# Patient Record
Sex: Female | Born: 1960
Health system: Southern US, Community
[De-identification: ages and names within clinical notes are randomized; demographics above are authoritative.]

## PROBLEM LIST (undated history)

## (undated) DIAGNOSIS — Z8249 Family history of ischemic heart disease and other diseases of the circulatory system: Secondary | ICD-10-CM

## (undated) DIAGNOSIS — I493 Ventricular premature depolarization: Secondary | ICD-10-CM

## (undated) DIAGNOSIS — R0609 Other forms of dyspnea: Principal | ICD-10-CM

## (undated) DIAGNOSIS — I5032 Chronic diastolic (congestive) heart failure: Secondary | ICD-10-CM

## (undated) HISTORY — PX: HERNIA REPAIR: SHX51

## (undated) HISTORY — PX: OTHER SURGICAL HISTORY: SHX169

## (undated) HISTORY — DX: Other forms of dyspnea: R06.09

## (undated) HISTORY — DX: Chronic diastolic (congestive) heart failure: I50.32

## (undated) HISTORY — DX: Family history of ischemic heart disease and other diseases of the circulatory system: Z82.49

## (undated) HISTORY — PX: APPENDECTOMY: SHX54

## (undated) HISTORY — DX: Ventricular premature depolarization: I49.3

## (undated) HISTORY — PX: ABDOMINAL HYSTERECTOMY: SHX81

---

## 2007-08-14 ENCOUNTER — Emergency Department (HOSPITAL_COMMUNITY): Admission: EM | Admit: 2007-08-14 | Discharge: 2007-08-14 | Payer: Self-pay | Admitting: Emergency Medicine

## 2008-10-25 ENCOUNTER — Other Ambulatory Visit: Admission: RE | Admit: 2008-10-25 | Discharge: 2008-10-25 | Payer: Self-pay | Admitting: Family Medicine

## 2008-11-29 ENCOUNTER — Encounter (INDEPENDENT_AMBULATORY_CARE_PROVIDER_SITE_OTHER): Payer: Self-pay | Admitting: Obstetrics and Gynecology

## 2008-11-29 ENCOUNTER — Ambulatory Visit (HOSPITAL_COMMUNITY): Admission: RE | Admit: 2008-11-29 | Discharge: 2008-11-30 | Payer: Self-pay | Admitting: Obstetrics and Gynecology

## 2011-01-08 LAB — BASIC METABOLIC PANEL
BUN: 11 mg/dL (ref 6–23)
CO2: 26 mEq/L (ref 19–32)
Creatinine, Ser: 0.53 mg/dL (ref 0.4–1.2)
GFR calc non Af Amer: >60 mL/min (ref >60)
Potassium: 3.5 mEq/L (ref 3.5–5.1)
Sodium: 138 mEq/L (ref 135–145)

## 2011-01-08 LAB — CBC
HCT: 35.2 % — ABNORMAL LOW (ref 36.0–46.0)
Hemoglobin: 12 g/dL (ref 12.0–15.0)
MCHC: 33.8 g/dL (ref 30.0–36.0)
MCV: 95 fL (ref 78.0–100.0)
MCV: 95.3 fL (ref 78.0–100.0)
RBC: 3.69 MIL/uL — ABNORMAL LOW (ref 3.87–5.11)
RDW: 12.9 % (ref 11.5–15.5)

## 2011-01-08 LAB — HCG, SERUM, QUALITATIVE: Preg, Serum: NEGATIVE

## 2011-02-10 NOTE — H&P (Signed)
NAMEJAYME, [name]                  ACCOUNT NO.:  [id number]   MEDICAL RECORD NO.:  [id number]          PATIENT TYPE:  AMB   LOCATION:  SDC                           [facility]  [facility]   PHYSICIAN:  Charles A. Delcambre, MDDATE OF BIRTH:  25-Jan-1961   DATE OF ADMISSION:  DATE OF DISCHARGE:                              HISTORY & PHYSICAL   She is a 50 year old gravida 0, para 0, not desiring further  childbearing capacity with a very symptomatic uterus, fibroid uterine  with fibroids.  She has some pelvic pressure.  Urinary frequency without  infection being noted in the urinalysis at her PCP Yvonne [name] at  [facility].  Pelvic pressure, pants do not fit, and bloated feeling much  exacerbated over the last 6 months.   PAST MEDICAL HISTORY:  None.  She states she was nonanemic with check  with Yvonne [name].   SURGICAL HISTORY:  Lymph nodes in her submaxillary area removed, benign  laparotomy for appendectomy, chronic rupture, inguinal hernia on the  left fifth digit and pinning.   MEDICATIONS:  DayQuil for cold every now and then, but none other.   ALLERGIES:  No known drug allergies.   SOCIAL HISTORY:  No tobacco, one drink per month.  She is married,  sexual active with her husband, monogamous relationship.  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   FAMILY HISTORY:  Heart attack for father died at 74 year old of heart  disease.   REVIEW OF SYSTEMS:  Denies chest pain, shortness of breath, nausea or  vomiting, diarrhea, or constipation.  She does have urinary frequency.  Her periods are heavy with changing the pads every hour on the second  and third day of the four-day period.  Periods are regular.  She does  have pelvic pain and pressure as her main problem at this point.  She  states it is the urinary frequency and pressure that she feels something  special in the bladder.   PHYSICAL EXAMINATION:  GENERAL:  Alert and oriented x3.  VITAL SIGNS:  Blood  pressure 128/78, height 5 feet 8 inches, and weight  163 pounds.  CORONARY:  Regular rate and rhythm.  LUNGS: Clear bilaterally.  ABDOMEN:  Enlarged uterus palpable consistent with fibroids of about 18-  week size, nontender, mobile.  PELVIC:  Deferred.  Ultrasound did show multiple fibroids, the largest  of which was 12 cm along with similar uterine segment fibroids as well.  We will plan on n.p.o. past midnight before we proceed with the surgery.   Risks of  infection, bleeding, bowel and bladder damage, ureteral  damage, blood product risk including hepatitis and HIV exposure, DVT,  were all discussed.  All questions were answered and she gives informed  consent.  At the superior portion of her previous laparotomy, she feels  she might be getting a hernia in this area.  I stated we would  palpate from inside this area.  If anything major was found, she will be  referred to General Surgery for mesh placement, etc.  Otherwise, if a  small defect is noted, I will ectend the incision up to that point and  plicate and reinforce the fascial closure at that level.  She is in  agreement with this plan.  We will proceed as outlined.      Charles A. Sydnee Cabal, MD  Electronically Signed     CAD/MEDQ  D:  11/20/2008  T:  11/21/2008  Job:  [id number]
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
## 2011-02-10 NOTE — Op Note (Signed)
NAMEROYELLE, HINCHMAN                  ACCOUNT NO.:  [id number]   MEDICAL RECORD NO.:  [id number]          PATIENT TYPE:  INP   LOCATION:  9310                          [facility]ILITY:  [facility]   PHYSICIAN:  Charles A. Delcambre, MDDATE OF BIRTH:  24-Nov-1960   DATE OF PROCEDURE:  11/29/2008  DATE OF DISCHARGE:                               OPERATIVE REPORT   PREOPERATIVE DIAGNOSES:  Symptomatic uterine fibroids at 20 weeks.  Symptomatic with pain and pressure and an enlarging abdomen.   POSTOPERATIVE DIAGNOSIS:  Symptomatic uterine fibroids at 20 weeks.   PROCEDURE:  Transabdominal hysterectomy   SURGEON:  Charles A. Sydnee Cabal, MD   ASSISTANT:  Gerald Leitz, MD   ANESTHESIA:  General by the endotracheal route.   FINDINGS:  Large uterine fibroids, multiple impinging on the bladder and  the sigmoid posteriorly, normal ovaries.   SPECIMEN:  Uterus with cervix to Pathology.   ESTIMATED BLOOD LOSS:  250 mL.   URINE OUTPUT:  150 mL.   SPECIMEN:  Sponge and needle count correct x2.   COMPLICATIONS:  None.   DESCRIPTION OF PROCEDURE:  The patient was taken to the operating room,  placed in supine position.  General anesthetic was induced without  difficulty.  She was then sterilely prepped and draped.  Vertical skin  incision was made up to the umbilicus.  Rectus muscles were bluntly and  sharply dissected.  Peritoneum was opened with Metzenbaum scissors  without damage to the bowel.  This incision was taken superiorly and  inferiorly.  There were no adhesions noted to the previous laparotomy  for ruptured appendix.  Fascia and peritoneum were taken down to the  symphysis pubis.  This allowed enough room to expose the large uterus,  but not get a retractor in.  At that time, round ligaments were taken,  transfixion stitched, and held.  Cautery was used to open the  peritoneum.  Metzenbaum scissors were used to take the parietal  peritoneum down to the lower uterine segment over a large  fibroid.  Broad ligament was opened on the right with single-pass screw bluntly  per her request.  Ovaries were retained.  Utero-ovarian pedicle was cut,  free tied, and then transfixion stitch was placed on the ovarian pedicle  with good hemostasis resulting.  The same was done on the left side  opening the round ligament, taken the bladder down anteriorly passing  through the broad ligament isolating the utero-ovarian pedicle and  clamping, cutting, and free tied and then transfixion stitch, hemostasis  was excellent.  Attention was then given to further taking down the  bladder anteriorly.  This was done with the Metzenbaum scissors and some  blunt dissection.  Uterine vessels were able to be skeletonized on  either side. I could not fit a retractor in at this point, but a Lahey  clamp on the fundus as well as Kelly clamps on the cornual areas did  allow [place] enough mobilization to see adequately the areas we were  working.  Uterine vessels were taken on the left with backbleeding clamp  and transfixion stitched.  The same was done on the right.  Then a  second clamp was taken down on the left across the uterosacrals and the  vaginal angle, and vaginal angle was opened.  This was transfixion  stitched and held.  One further clamp was taken down on the right.  I  did not get in to the vagina.  It was transfixion stitched.  One further  clamp around, I did get into the vagina.  This was transfixion stitched  and held.  Jorgenson scissors were used to excise the cervix from the  vagina.  Kochers were placed on the angles and the anterior and  posterior leaves of the vagina.  Richardson angle sutures were placed on  either side and tied.  A running 0 locking suture was used to close the  cuff.  Irrigation was carried out.  Minor electrocautery was used on the  bladder edge superiorly.  A single figure-of-eight suture was placed in  the bladder around this area to achieve better hemostasis.   Hemostasis  was excellent.  Irrigation was carried out.  Again, all pedicles were of  excellent hemostasis.  Cuff was of excellent hemostasis.  The ovaries  were free and were sutured to the round ligaments to suspend the amount  of the cuff area.  This was done without difficulty.  We had used 1 lap  in the case and we removed this.  Lap count was correct, and we  proceeded with an en bloc closure with 0 double-stranded PDS loop.  Loop  knot was buried suprapubically once tied.  Irrigation was carried out.  Incision was relaxed some to come together better with cautery.  Hemostasis was excellent, and staples were used close the skin.  Sterile  dressing was applied.  The patient was taken to recovery with physician  in attendance having tolerated the procedure well.      Charles A. Sydnee Cabal, MD  Electronically Signed     CAD/MEDQ  D:  11/29/2008  T:  11/30/2008  Job:  [id number]

## 2012-08-10 ENCOUNTER — Other Ambulatory Visit: Payer: Self-pay | Admitting: Family Medicine

## 2012-08-10 DIAGNOSIS — Z139 Encounter for screening, unspecified: Secondary | ICD-10-CM

## 2012-08-17 ENCOUNTER — Ambulatory Visit
Admission: RE | Admit: 2012-08-17 | Discharge: 2012-08-17 | Disposition: A | Discharge status: Home / Self Care | Payer: No Typology Code available for payment source | Source: Ambulatory Visit | Attending: Family Medicine | Admitting: Family Medicine

## 2012-08-17 DIAGNOSIS — Z139 Encounter for screening, unspecified: Secondary | ICD-10-CM

## 2013-08-18 ENCOUNTER — Other Ambulatory Visit: Payer: Self-pay

## 2013-08-18 DIAGNOSIS — Z1231 Encounter for screening mammogram for malignant neoplasm of breast: Secondary | ICD-10-CM

## 2013-09-15 ENCOUNTER — Ambulatory Visit: Payer: No Typology Code available for payment source

## 2013-10-10 ENCOUNTER — Ambulatory Visit
Admission: RE | Admit: 2013-10-10 | Discharge: 2013-10-10 | Disposition: A | Discharge status: Home / Self Care | Payer: No Typology Code available for payment source | Source: Ambulatory Visit

## 2013-10-10 DIAGNOSIS — Z1231 Encounter for screening mammogram for malignant neoplasm of breast: Secondary | ICD-10-CM

## 2014-09-24 ENCOUNTER — Other Ambulatory Visit: Payer: Self-pay

## 2014-09-24 DIAGNOSIS — Z1231 Encounter for screening mammogram for malignant neoplasm of breast: Secondary | ICD-10-CM

## 2014-10-17 ENCOUNTER — Encounter (INDEPENDENT_AMBULATORY_CARE_PROVIDER_SITE_OTHER): Payer: Self-pay

## 2014-10-17 ENCOUNTER — Ambulatory Visit
Admission: RE | Admit: 2014-10-17 | Discharge: 2014-10-17 | Disposition: A | Discharge status: Home / Self Care | Payer: No Typology Code available for payment source | Source: Ambulatory Visit

## 2014-10-17 DIAGNOSIS — Z1231 Encounter for screening mammogram for malignant neoplasm of breast: Secondary | ICD-10-CM

## 2015-09-25 ENCOUNTER — Other Ambulatory Visit (HOSPITAL_BASED_OUTPATIENT_CLINIC_OR_DEPARTMENT_OTHER): Payer: Self-pay | Admitting: Family Medicine

## 2015-09-25 DIAGNOSIS — Z1231 Encounter for screening mammogram for malignant neoplasm of breast: Secondary | ICD-10-CM

## 2015-11-01 ENCOUNTER — Ambulatory Visit: Payer: No Typology Code available for payment source

## 2015-11-06 ENCOUNTER — Ambulatory Visit
Admission: RE | Admit: 2015-11-06 | Discharge: 2015-11-06 | Disposition: A | Discharge status: Home / Self Care | Payer: Managed Care, Other (non HMO) | Source: Ambulatory Visit | Attending: Family Medicine | Admitting: Family Medicine

## 2015-11-06 DIAGNOSIS — Z1231 Encounter for screening mammogram for malignant neoplasm of breast: Secondary | ICD-10-CM

## 2016-10-26 ENCOUNTER — Other Ambulatory Visit: Payer: Self-pay | Admitting: Family Medicine

## 2016-10-26 DIAGNOSIS — Z1231 Encounter for screening mammogram for malignant neoplasm of breast: Secondary | ICD-10-CM

## 2016-11-25 ENCOUNTER — Ambulatory Visit
Admission: RE | Admit: 2016-11-25 | Discharge: 2016-11-25 | Disposition: A | Discharge status: Home / Self Care | Payer: 59 | Source: Ambulatory Visit | Attending: Family Medicine | Admitting: Family Medicine

## 2016-11-25 DIAGNOSIS — Z1231 Encounter for screening mammogram for malignant neoplasm of breast: Secondary | ICD-10-CM

## 2017-10-20 ENCOUNTER — Other Ambulatory Visit: Payer: Self-pay | Admitting: Nurse Practitioner

## 2017-10-20 DIAGNOSIS — Z1231 Encounter for screening mammogram for malignant neoplasm of breast: Secondary | ICD-10-CM

## 2017-12-08 ENCOUNTER — Ambulatory Visit
Admission: RE | Admit: 2017-12-08 | Discharge: 2017-12-08 | Disposition: A | Discharge status: Home / Self Care | Payer: BLUE CROSS/BLUE SHIELD | Source: Ambulatory Visit | Attending: Nurse Practitioner | Admitting: Nurse Practitioner

## 2017-12-08 DIAGNOSIS — Z1231 Encounter for screening mammogram for malignant neoplasm of breast: Secondary | ICD-10-CM

## 2017-12-30 ENCOUNTER — Encounter: Payer: Self-pay | Admitting: Cardiovascular Disease

## 2017-12-30 ENCOUNTER — Ambulatory Visit (INDEPENDENT_AMBULATORY_CARE_PROVIDER_SITE_OTHER): Payer: BLUE CROSS/BLUE SHIELD | Admitting: Cardiovascular Disease

## 2017-12-30 VITALS — BP 132/92 | HR 65 | Ht 68.0 in | Wt 175.8 lb

## 2017-12-30 DIAGNOSIS — R0789 Other chest pain: Secondary | ICD-10-CM

## 2017-12-30 DIAGNOSIS — R55 Syncope and collapse: Secondary | ICD-10-CM

## 2017-12-30 DIAGNOSIS — R0609 Other forms of dyspnea: Secondary | ICD-10-CM

## 2017-12-30 DIAGNOSIS — I493 Ventricular premature depolarization: Secondary | ICD-10-CM | POA: Diagnosis not present

## 2017-12-30 DIAGNOSIS — Z8249 Family history of ischemic heart disease and other diseases of the circulatory system: Secondary | ICD-10-CM

## 2017-12-30 DIAGNOSIS — R0600 Dyspnea, unspecified: Secondary | ICD-10-CM

## 2017-12-30 HISTORY — DX: Family history of ischemic heart disease and other diseases of the circulatory system: Z82.49

## 2017-12-30 HISTORY — DX: Other forms of dyspnea: R06.09

## 2017-12-30 HISTORY — DX: Ventricular premature depolarization: I49.3

## 2017-12-30 HISTORY — DX: Dyspnea, unspecified: R06.00

## 2017-12-30 LAB — COMPREHENSIVE METABOLIC PANEL
ALK PHOS: 54 IU/L (ref 39–117)
ALT: 14 IU/L (ref 0–32)
AST: 18 IU/L (ref 0–40)
Albumin/Globulin Ratio: 1.6 (ref 1.2–2.2)
Albumin: 4.2 g/dL (ref 3.5–5.5)
BUN/Creatinine Ratio: 25 — ABNORMAL HIGH (ref 9–23)
BUN: 18 mg/dL (ref 6–24)
Bilirubin Total: 0.5 mg/dL (ref 0.0–1.2)
CALCIUM: 9.7 mg/dL (ref 8.7–10.2)
CO2: 24 mmol/L (ref 20–29)
CREATININE: 0.72 mg/dL (ref 0.57–1.00)
Chloride: 102 mmol/L (ref 96–106)
GFR calc Af Amer: 108 mL/min/{1.73_m2} (ref >59)
GFR, EST NON AFRICAN AMERICAN: 94 mL/min/{1.73_m2} (ref >59)
Globulin, Total: 2.6 g/dL (ref 1.5–4.5)
Glucose: 85 mg/dL (ref 65–99)
Potassium: 4.1 mmol/L (ref 3.5–5.2)
Sodium: 141 mmol/L (ref 134–144)
Total Protein: 6.8 g/dL (ref 6.0–8.5)

## 2017-12-30 LAB — CBC WITH DIFFERENTIAL/PLATELET
BASOS ABS: 0 10*3/uL (ref 0.0–0.2)
Basos: 0 %
EOS (ABSOLUTE): 0.2 10*3/uL (ref 0.0–0.4)
Eos: 4 %
HEMOGLOBIN: 13.6 g/dL (ref 11.1–15.9)
Hematocrit: 39.6 % (ref 34.0–46.6)
IMMATURE GRANS (ABS): 0 10*3/uL (ref 0.0–0.1)
IMMATURE GRANULOCYTES: 0 %
LYMPHS: 35 %
Lymphocytes Absolute: 1.7 10*3/uL (ref 0.7–3.1)
MCH: 31.1 pg (ref 26.6–33.0)
MCHC: 34.3 g/dL (ref 31.5–35.7)
MCV: 91 fL (ref 79–97)
MONOCYTES: 12 %
Monocytes Absolute: 0.6 10*3/uL (ref 0.1–0.9)
NEUTROS ABS: 2.3 10*3/uL (ref 1.4–7.0)
Neutrophils: 49 %
Platelets: 262 10*3/uL (ref 150–379)
RBC: 4.37 x10E6/uL (ref 3.77–5.28)
RDW: 12.6 % (ref 12.3–15.4)
WBC: 4.7 10*3/uL (ref 3.4–10.8)

## 2017-12-30 LAB — TSH: TSH: 1.91 u[IU]/mL (ref 0.450–4.500)

## 2017-12-30 LAB — MAGNESIUM: MAGNESIUM: 1.8 mg/dL (ref 1.6–2.3)

## 2017-12-30 LAB — T4, FREE: FREE T4: 1.21 ng/dL (ref 0.82–1.77)

## 2017-12-30 NOTE — Patient Instructions (Addendum)
Medication Instructions:  Your physician recommends that you continue on your current medications as directed. Please refer to the Current Medication list given to you today.  Labwork: CMET/CBC/TSH/FT4/MAGNESIUM TODAY   Testing/Procedures: Your physician has requested that you have an echocardiogram. Echocardiography is a painless test that uses sound waves to create images of your heart. It provides your doctor with information about the size and shape of your heart and how well your heart's chambers and valves are working. This procedure takes approximately one hour. There are no restrictions for this procedure. [facility] AT [address]  CARDIAC CTA   Follow-Up: Your physician recommends that you schedule a follow-up appointment in: 4-6 WEEKS  Any Other Special Instructions Will Be Listed Below (If Applicable).  Echocardiogram An echocardiogram, or echocardiography, uses sound waves (ultrasound) to produce an image of your heart. The echocardiogram is simple, painless, obtained within a short period of time, and offers valuable information to your health care provider. The images from an echocardiogram can provide information such as:  Evidence of coronary artery disease (CAD).  Heart size.  Heart muscle function.  Heart valve function.  Aneurysm detection.  Evidence of a past heart attack.  Fluid buildup around the heart.  Heart muscle thickening.  Assess heart valve function.  Tell a health care provider about:  Any allergies you have.  All medicines you are taking, including vitamins, herbs, eye drops, creams, and over-the-counter medicines.  Any problems you or family members have had with anesthetic medicines.  Any blood disorders you have.  Any surgeries you have had.  Any medical conditions you have.  Whether you are pregnant or may be pregnant. What happens before the procedure? No special preparation is needed. Eat and drink  normally. What happens during the procedure?  In order to produce an image of your heart, gel will be applied to your chest and a wand-like tool (transducer) will be moved over your chest. The gel will help transmit the sound waves from the transducer. The sound waves will harmlessly bounce off your heart to allow the heart images to be captured in real-time motion. These images will then be recorded.  You may need an IV to receive a medicine that improves the quality of the pictures. What happens after the procedure? You may return to your normal schedule including diet, activities, and medicines, unless your health care provider tells you otherwise. This information is not intended to replace advice given to you by your health care provider. Make sure you discuss any questions you have with your health care provider. Document Released: 09/11/2000 Document Revised: 05/02/2016 Document Reviewed: 05/22/2013 Elsevier Interactive Patient Education  2017 Elsevier Inc.    Cardiac CT Angiogram A cardiac CT angiogram is a procedure to look at the heart and the area around the heart. It may be done to help find the cause of chest pains or other symptoms of heart disease. During this procedure, a large X-ray machine, called a CT scanner, takes detailed pictures of the heart and the surrounding area after a dye (contrast material) has been injected into blood vessels in the area. The procedure is also sometimes called a coronary CT angiogram, coronary artery scanning, or CTA. A cardiac CT angiogram allows the health care provider to see how well blood is flowing to and from the heart. The health care provider will be able to see if there are any problems, such as:  Blockage or narrowing of the coronary arteries in  the heart.  Fluid around the heart.  Signs of weakness or disease in the muscles, valves, and tissues of the heart.  Tell a health care provider about:  Any allergies you have. This is  especially important if you have had a previous allergic reaction to contrast dye.  All medicines you are taking, including vitamins, herbs, eye drops, creams, and over-the-counter medicines.  Any blood disorders you have.  Any surgeries you have had.  Any medical conditions you have.  Whether you are pregnant or may be pregnant.  Any anxiety disorders, chronic pain, or other conditions you have that may increase your stress or prevent you from lying still. What are the risks? Generally, this is a safe procedure. However, problems may occur, including:  Bleeding.  Infection.  Allergic reactions to medicines or dyes.  Damage to other structures or organs.  Kidney damage from the dye or contrast that is used.  Increased risk of cancer from radiation exposure. This risk is low. Talk with your health care provider about: ? The risks and benefits of testing. ? How you can receive the lowest dose of radiation.  What happens before the procedure?  Wear comfortable clothing and remove any jewelry, glasses, dentures, and hearing aids.  Follow instructions from your health care provider about eating and drinking. This may include: ? For 12 hours before the test - avoid caffeine. This includes tea, coffee, soda, energy drinks, and diet pills. Drink plenty of water or other fluids that do not have caffeine in them. Being well-hydrated can prevent complications. ? For 4-6 hours before the test - stop eating and drinking. The contrast dye can cause nausea, but this is less likely if your stomach is empty.  Ask your health care provider about changing or stopping your regular medicines. This is especially important if you are taking diabetes medicines, blood thinners, or medicines to treat erectile dysfunction. What happens during the procedure?  Hair on your chest may need to be removed so that small sticky patches called electrodes can be placed on your chest. These will transmit  information that helps to monitor your heart during the test.  An IV tube will be inserted into one of your veins.  You might be given a medicine to control your heart rate during the test. This will help to ensure that good images are obtained.  You will be asked to lie on an exam table. This table will slide in and out of the CT machine during the procedure.  Contrast dye will be injected into the IV tube. You might feel warm, or you may get a metallic taste in your mouth.  You will be given a medicine (nitroglycerin) to relax (dilate) the arteries in your heart.  The table that you are lying on will move into the CT machine tunnel for the scan.  The person running the machine will give you instructions while the scans are being done. You may be asked to: ? Keep your arms above your head. ? Hold your breath. ? Stay very still, even if the table is moving.  When the scanning is complete, you will be moved out of the machine.  The IV tube will be removed. The procedure may vary among health care providers and hospitals. What happens after the procedure?  You might feel warm, or you may get a metallic taste in your mouth from the contrast dye.  You may have a headache from the nitroglycerin.  After the procedure, drink water  or other fluids to wash (flush) the contrast material out of your body.  Contact a health care provider if you have any symptoms of allergy to the contrast. These symptoms include: ? Shortness of breath. ? Rash or hives. ? A racing heartbeat.  Most people can return to their normal activities right after the procedure. Ask your health care provider what activities are safe for you.  It is up to you to get the results of your procedure. Ask your health care provider, or the department that is doing the procedure, when your results will be ready. Summary  A cardiac CT angiogram is a procedure to look at the heart and the area around the heart. It may be done  to help find the cause of chest pains or other symptoms of heart disease.  During this procedure, a large X-ray machine, called a CT scanner, takes detailed pictures of the heart and the surrounding area after a dye (contrast material) has been injected into blood vessels in the area.  Ask your health care provider about changing or stopping your regular medicines before the procedure. This is especially important if you are taking diabetes medicines, blood thinners, or medicines to treat erectile dysfunction.  After the procedure, drink water or other fluids to wash (flush) the contrast material out of your body. This information is not intended to replace advice given to you by your health care provider. Make sure you discuss any questions you have with your health care provider. Document Released: 08/27/2008 Document Revised: 08/03/2016 Document Reviewed: 08/03/2016 Elsevier Interactive Patient Education  2017 [employer].  Please arrive at the [employer] main entrance of [facility] at xx:xx AM (30-45 minutes prior to test start time)  [facility] [address] [place], [place] [address] [phone]  Proceed to the [facility] Radiology Department (First Floor).  Please follow these instructions carefully (unless otherwise directed):  Hold all erectile dysfunction medications at least 48 hours prior to test.  On the Night Before the Test: . Drink plenty of water. . Do not consume any caffeinated/decaffeinated beverages or chocolate 12 hours prior to your test. . Do not take any antihistamines 12 hours prior to your test. On the Day of the Test: . Drink plenty of water. Do not drink any water within one hour of the test. . Do not eat any food 4 hours prior to the test. . You may take your regular medications prior to the test.  After the Test: . Drink plenty of water. . After receiving IV contrast, you may experience a mild flushed feeling. This is  normal. . On occasion, you may experience a mild rash up to 24 hours after the test. This is not dangerous. If this occurs, you can take Benadryl 25 mg and increase your fluid intake. . If you experience trouble breathing, this can be serious. If it is severe call 911 IMMEDIATELY. If it is mild, please call our office. . If you take any of these medications: Glipizide/Metformin, Avandament, Glucavance, please do not take 48 hours after completing test.

## 2017-12-30 NOTE — Progress Notes (Signed)
Cardiology Office Note   Date:  12/30/2017   ID:  [name], DOB 12/05/1960, MRN [id number]  PCP:  [place], Julie M, NP  Cardiologist:   Chilton Siiffany [place], MD   Chief Complaint  Patient presents with  . Follow-up    pt c/o SOB on exertion, pt denied chest pain     History of Present Illness: Yvonne SessionsLisa R [name] is a 57 y.o. female who is being seen today for the evaluation of shortness of breath at the request of [place], Julie M, NP.  Yvonne [name]'s father had an MI at age 57 and then died of a second MI at age 57.  She has tried to be proactive about her health and started biking in colleges.  She rides her bike over 1,000 miles per year.  In the past she had no exertional symptoms.  She has not experienced any chest pain but notes that over the last 4-6 weeks she is not feeling as well.  She is unable to push herself as much.  She is much slower on the bike despite exerting herself equally or even more than in the past.  She also notices that her heart rate increases to 150 or 160 upon minimal exertion.  In the past it only got this high with peak exercise.  She had an episode of near syncope after riding for only 7 or 8 miles.  She had to stop and lie on the ground in order to avoid passing out.  She has not noted any lower extremity edema orthopnea.  She does get tired when walking upstairs.  She checks her blood pressure at home and at work and it typically is around 115/70.  She has not felt any palpitations.   Past Medical History:  Diagnosis Date  . Exertional dyspnea 12/30/2017  . Family history of premature CAD 12/30/2017   Father first MI at age 57, died at age 10134.   Marland Kitchen. PVC's (premature ventricular contractions) 12/30/2017    Past Surgical History:  Procedure Laterality Date  . ABDOMINAL HYSTERECTOMY     uterus cysts 2009  . APPENDECTOMY    . HERNIA REPAIR    . lymph node removal     1989     Current Outpatient Medications  Medication Sig Dispense Refill  . ASHWAGANDHA PO Take  470 mg by mouth daily.    Marland Kitchen. aspirin EC 81 MG tablet Take 81 mg by mouth daily.    . calcium carbonate (CALCIUM 600) 600 MG TABS tablet Take 600 mg by mouth daily.    . Coenzyme Q10 (CO Q 10) 100 MG CAPS Take 1 tablet by mouth daily.    Marland Kitchen. RHODIOLA ROSEA PO Take 700 mg by mouth daily.     No current facility-administered medications for this visit.     Allergies:   Patient has no known allergies.    Social History:  The patient  reports that she has never smoked. She has never used smokeless tobacco. She reports that she drinks about 0.6 - 1.2 oz of alcohol per week.   Family History:  The patient's family history includes Alzheimer's disease in her paternal grandmother; COPD in her maternal grandfather; Heart attack in her father; Liver disease in her mother.    ROS:  Please see the history of present illness.   Otherwise, review of systems are positive for none.   All other systems are reviewed and negative.    PHYSICAL EXAM: VS:  BP (!) 132/92  Pulse 65   Ht 5\' 8"  (1.727 m)   Wt 175 lb 12.8 oz (79.7 kg)   BMI 26.73 kg/m  , BMI Body mass index is 26.73 kg/m. GENERAL:  Well appearing.  No acute distress.  HEENT:  Pupils equal round and reactive, fundi not visualized, oral mucosa unremarkable NECK:  No jugular venous distention, waveform within normal limits, carotid upstroke brisk and symmetric, no bruits, no thyromegaly LYMPHATICS:  No cervical adenopathy LUNGS:  Clear to auscultation bilaterally HEART:  Mostly regular with frequent ectopy.  PMI not displaced or sustained,S1 and S2 within normal limits, no S3, no S4, no clicks, no rubs, no murmurs ABD:  Flat, positive bowel sounds normal in frequency in pitch, no bruits, no rebound, no guarding, no midline pulsatile mass, no hepatomegaly, no splenomegaly EXT:  2 plus pulses throughout, no edema, no cyanosis no clubbing SKIN:  No rashes no nodules NEURO:  Cranial nerves II through XII grossly intact, motor grossly intact  throughout PSYCH:  Cognitively intact, oriented to person place and time   EKG:  EKG is ordered today. The ekg ordered today demonstrates sinus rhythm.  Rate 65 bpm.  Incomplete RBBB.  One PVC.    Recent Labs: No results found for requested labs within last 8760 hours.    Lipid Panel No results found for: CHOL, TRIG, HDL, CHOLHDL, VLDL, LDLCALC, LDLDIRECT    Wt Readings from Last 3 Encounters:  12/30/17 175 lb 12.8 oz (79.7 kg)      ASSESSMENT AND PLAN:  # Exertional dyspnea: # Family history of premature CAD:  Yvonne [name] has noted significant exertional changes and has a family history of very premature CAD.  We will get a coronary CT-A to assess for ischemia and to determine the need for statin therapy.  We will also get an echo given her dyspnea and PVCs.  # Frequent PVCs: # Near syncope:  Echo and check CMP, magnesium, CBC, TSH and free T4.    Current medicines are reviewed at length with the patient today.  The patient does not have concerns regarding medicines.  The following changes have been made:  no change  Labs/ tests ordered today include:   Orders Placed This Encounter  Procedures  . CT CORONARY MORPH W/CTA COR W/SCORE W/CA W/CM &/OR WO/CM  . CT CORONARY FRACTIONAL FLOW RESERVE DATA PREP  . CT CORONARY FRACTIONAL FLOW RESERVE FLUID ANALYSIS  . CBC with Differential/Platelet  . T4, free  . TSH  . Comprehensive metabolic panel  . Magnesium  . EKG 12-Lead  . ECHOCARDIOGRAM COMPLETE     Disposition:   FU with Tymara Saur C. Duke Salvia, MD, [facility] in 1 month.      Signed, Areeb Corron C. Duke Salvia, MD, [facility]  12/30/2017 11:01 AM    [facility]

## 2018-01-06 ENCOUNTER — Ambulatory Visit (HOSPITAL_COMMUNITY): Payer: BLUE CROSS/BLUE SHIELD | Attending: Cardiovascular Disease

## 2018-01-06 ENCOUNTER — Other Ambulatory Visit: Payer: Self-pay

## 2018-01-06 DIAGNOSIS — Z8249 Family history of ischemic heart disease and other diseases of the circulatory system: Secondary | ICD-10-CM | POA: Diagnosis not present

## 2018-01-06 DIAGNOSIS — I517 Cardiomegaly: Secondary | ICD-10-CM | POA: Insufficient documentation

## 2018-01-06 DIAGNOSIS — R0789 Other chest pain: Secondary | ICD-10-CM | POA: Diagnosis not present

## 2018-01-06 DIAGNOSIS — R0602 Shortness of breath: Secondary | ICD-10-CM | POA: Diagnosis present

## 2018-01-06 DIAGNOSIS — R0609 Other forms of dyspnea: Secondary | ICD-10-CM | POA: Diagnosis not present

## 2018-01-06 DIAGNOSIS — I493 Ventricular premature depolarization: Secondary | ICD-10-CM

## 2018-01-06 DIAGNOSIS — I341 Nonrheumatic mitral (valve) prolapse: Secondary | ICD-10-CM | POA: Insufficient documentation

## 2018-01-06 DIAGNOSIS — R0600 Dyspnea, unspecified: Secondary | ICD-10-CM

## 2018-01-11 ENCOUNTER — Telehealth: Payer: Self-pay | Admitting: *Deleted

## 2018-01-11 NOTE — Telephone Encounter (Signed)
Patient's manager came into request the patient's activation code for My Chart. She was advised that the patient would need to come herself or call since she was not listed on the DPR.  The manager gave the direct line to the patient. Call placed to the patient and the activation code was given.

## 2018-01-12 ENCOUNTER — Telehealth: Payer: Self-pay | Admitting: *Deleted

## 2018-01-12 ENCOUNTER — Encounter: Payer: Self-pay | Admitting: Cardiovascular Disease

## 2018-01-12 DIAGNOSIS — R0609 Other forms of dyspnea: Secondary | ICD-10-CM

## 2018-01-12 DIAGNOSIS — R0600 Dyspnea, unspecified: Secondary | ICD-10-CM

## 2018-01-12 MED ORDER — FUROSEMIDE 20 MG PO TABS: ORAL_TABLET | ORAL | 3 refills | Status: DC

## 2018-01-12 NOTE — Addendum Note (Signed)
Addended by: Regis BillPRATT, Taj Nevins B on: 01/12/2018 01:01 PM   Modules accepted: Orders

## 2018-01-12 NOTE — Telephone Encounter (Signed)
-----   Message from Chilton Siiffany [place], MD sent at 01/12/2018  5:59 AM EDT ----- Echo shows that her heart squeezes well but does not relax completely.  It also looks like she may be volume overloaded which could be the cause of her shortness of breath.  Recommend taking lasix 40mg  po daily x3 days then 20 mig daily.  Limit salt to 2g/day and fluid to 2L.  Please track your BP at home daily until follow up and bring to that appointment.  Based on her echo I suspect that this is the cause of her shortness of breath and would rather she wait for the CT than get a Lexiscan Myoview.

## 2018-01-12 NOTE — Telephone Encounter (Signed)
Advised patient of results and cancelled cardiac CT. Order placed for Lexi and sent message to precert and scheduling to call and arrange appointment.

## 2018-01-13 ENCOUNTER — Telehealth (HOSPITAL_COMMUNITY): Payer: Self-pay

## 2018-01-13 NOTE — Telephone Encounter (Signed)
Encounter complete. 

## 2018-01-18 ENCOUNTER — Ambulatory Visit (HOSPITAL_COMMUNITY)
Admission: RE | Admit: 2018-01-18 | Discharge: 2018-01-18 | Disposition: A | Discharge status: Home / Self Care | Payer: BLUE CROSS/BLUE SHIELD | Source: Ambulatory Visit | Attending: Cardiology | Admitting: Cardiology

## 2018-01-18 DIAGNOSIS — R0609 Other forms of dyspnea: Secondary | ICD-10-CM | POA: Insufficient documentation

## 2018-01-18 DIAGNOSIS — R9439 Abnormal result of other cardiovascular function study: Secondary | ICD-10-CM | POA: Insufficient documentation

## 2018-01-18 DIAGNOSIS — R0600 Dyspnea, unspecified: Secondary | ICD-10-CM

## 2018-01-18 LAB — MYOCARDIAL PERFUSION IMAGING
CHL CUP RESTING HR STRESS: 71 {beats}/min
CSEPPHR: 109 {beats}/min
LV sys vol: 59 mL
LVDIAVOL: 134 mL (ref 46–106)
SDS: 3
SRS: 1
SSS: 4
TID: 1.17

## 2018-01-18 MED ORDER — TECHNETIUM TC 99M TETROFOSMIN IV KIT
31.4000 | PACK | Freq: Once | INTRAVENOUS | Status: AC | PRN
  Administered 2018-01-18: 31.4 via INTRAVENOUS
  Filled 2018-01-18: qty 32

## 2018-01-18 MED ORDER — REGADENOSON 0.4 MG/5ML IV SOLN
0.4000 mg | Freq: Once | INTRAVENOUS | Status: AC
  Administered 2018-01-18: 0.4 mg via INTRAVENOUS

## 2018-01-18 MED ORDER — TECHNETIUM TC 99M TETROFOSMIN IV KIT
9.9000 | PACK | Freq: Once | INTRAVENOUS | Status: AC | PRN
  Administered 2018-01-18: 9.9 via INTRAVENOUS
  Filled 2018-01-18: qty 10

## 2018-01-27 ENCOUNTER — Ambulatory Visit (INDEPENDENT_AMBULATORY_CARE_PROVIDER_SITE_OTHER): Payer: BLUE CROSS/BLUE SHIELD | Admitting: Cardiovascular Disease

## 2018-01-27 ENCOUNTER — Encounter: Payer: Self-pay | Admitting: Cardiovascular Disease

## 2018-01-27 VITALS — BP 140/86 | HR 68 | Wt 174.4 lb

## 2018-01-27 DIAGNOSIS — I493 Ventricular premature depolarization: Secondary | ICD-10-CM

## 2018-01-27 DIAGNOSIS — Z5181 Encounter for therapeutic drug level monitoring: Secondary | ICD-10-CM | POA: Diagnosis not present

## 2018-01-27 DIAGNOSIS — I5031 Acute diastolic (congestive) heart failure: Secondary | ICD-10-CM | POA: Diagnosis not present

## 2018-01-27 DIAGNOSIS — I5032 Chronic diastolic (congestive) heart failure: Secondary | ICD-10-CM

## 2018-01-27 HISTORY — DX: Chronic diastolic (congestive) heart failure: I50.32

## 2018-01-27 NOTE — Patient Instructions (Addendum)
Medication Instructions:  Your physician recommends that you continue on your current medications as directed. Please refer to the Current Medication list given to you today.  Labwork: BMET TODAY   Testing/Procedures: Your physician has requested that you have a cardiac MRI. Cardiac MRI uses a computer to create images of your heart as its beating, producing both still and moving pictures of your heart and major blood vessels. For further information please visit [email or web address]. Please follow the instruction sheet given to you today for more information.  Follow-Up: Your physician wants you to follow-up in: 6 month ov  You will receive a reminder letter in the mail two months in advance. If you don't receive a letter, please call our office to schedule the follow-up appointment.   If you need a refill on your cardiac medications before your next appointment, please call your pharmacy.

## 2018-01-27 NOTE — Progress Notes (Signed)
Cardiology Office Note   Date:  01/27/2018   ID:  Jalilah, Wiltsie 1961/03/23, MRN [id number]  PCP:  [place], Julie M, NP  Cardiologist:   Chilton Si, MD   No chief complaint on file.    History of Present Illness: MAKINNA ANDY is a 57 y.o. female with chronic diastolic heart failure here for follow-up.  She was initially seen 12/2017 for evaluation of shortness of breath.  Ms. Quinones father had an MI at age 67 and then died of a second MI at age 10.  She has tried to be proactive about her health and started biking in college.  She rides her bike over 1,000 miles per year.  In the past she had no exertional symptoms.  Over the preceding 4 to 6 months she noted increasing dyspnea on exertion and was unable to exert herself as she had in the past.  She was referred for a coronary CT-A and an echocardiogram.She was unable to wait for the coronary CT-need to be performed so she had a [employer] 12/2017 that revealed LVEF 56% and no ischemia.  She also had an echo that revealed LVEF 60 to 65% with grade 2 diastolic dysfunction.  Her IVC was dilated and did not collapse indicating a right atrial pressure of at least 15 mmHg.    After her echo findings Ms. Norrod was started on Lasix.  After 2 days she noted significant improvement in her breathing.  She has been able to walk up and down stairs and ride her bike without dyspnea.  She has not noted any lower extremity edema, orthopnea, or PND.  She denies chest pain or pressure.  She has not noted any palpitations, lightheadedness, or dizziness.  She has been tracking her blood pressure at home.  It typically runs in the 110s to 120s.  She had one day where her blood pressure was in the 150s systolic.  Since starting Lasix her blood pressure has been in the 90s to 100s at times.  She has no lightheadedness or dizziness when this occurs.   Past Medical History:  Diagnosis Date  . Chronic diastolic heart failure (HCC) 01/27/2018  . Exertional  dyspnea 12/30/2017  . Family history of premature CAD 12/30/2017   Father first MI at age 69, died at age 71.   Marland Kitchen PVC's (premature ventricular contractions) 12/30/2017    Past Surgical History:  Procedure Laterality Date  . ABDOMINAL HYSTERECTOMY     uterus cysts 2009  . APPENDECTOMY    . HERNIA REPAIR    . lymph node removal     1989     Current Outpatient Medications  Medication Sig Dispense Refill  . ASHWAGANDHA PO Take 470 mg by mouth daily.    Marland Kitchen aspirin EC 81 MG tablet Take 81 mg by mouth daily.    . calcium carbonate (CALCIUM 600) 600 MG TABS tablet Take 600 mg by mouth daily.    . Coenzyme Q10 (CO Q 10) 100 MG CAPS Take 1 tablet by mouth daily.    . furosemide (LASIX) 20 MG tablet Take 2 tablets by mouth for 3 days and then 1 daily 35 tablet 3  . RHODIOLA ROSEA PO Take 700 mg by mouth daily.     No current facility-administered medications for this visit.     Allergies:   Patient has no known allergies.    Social History:  The patient  reports that she has never smoked. She has never used  smokeless tobacco. She reports that she drinks about 0.6 - 1.2 oz of alcohol per week.   Family History:  The patient's family history includes Alzheimer's disease in her paternal grandmother; COPD in her maternal grandfather; Heart attack in her father; Liver disease in her mother.    ROS:  Please see the history of present illness.   Otherwise, review of systems are positive for none.   All other systems are reviewed and negative.    PHYSICAL EXAM: VS:  BP 140/86   Pulse 68   Wt 174 lb 6.4 oz (79.1 kg)   BMI 26.52 kg/m  , BMI Body mass index is 26.52 kg/m. GENERAL:  Well appearing HEENT: Pupils equal round and reactive, fundi not visualized, oral mucosa unremarkable NECK:  No jugular venous distention, waveform within normal limits, carotid upstroke brisk and symmetric, no bruits LUNGS:  Clear to auscultation bilaterally HEART:  RRR.  PMI not displaced or sustained,S1 and S2  within normal limits, no S3, no S4, no clicks, no rubs, no murmurs ABD:  Flat, positive bowel sounds normal in frequency in pitch, no bruits, no rebound, no guarding, no midline pulsatile mass, no hepatomegaly, no splenomegaly EXT:  2 plus pulses throughout, no edema, no cyanosis no clubbing SKIN:  No rashes no nodules NEURO:  Cranial nerves II through XII grossly intact, motor grossly intact throughout PSYCH:  Cognitively intact, oriented to person place and time    EKG:  EKG is ordered today. The ekg ordered 12/30/17 demonstrates sinus rhythm.  Rate 65 bpm.  Incomplete RBBB.  One PVC.  01/27/17: Sinus rhythm.  Rate 68 bpm.  Incomplete RBBB.  QTc 482.    Recent Labs: 12/30/2017: ALT 14; BUN 18; Creatinine, Ser 0.72; Hemoglobin 13.6; Magnesium 1.8; Platelets 262; Potassium 4.1; Sodium 141; TSH 1.910    Lipid Panel No results found for: CHOL, TRIG, HDL, CHOLHDL, VLDL, LDLCALC, LDLDIRECT    Wt Readings from Last 3 Encounters:  01/27/18 174 lb 6.4 oz (79.1 kg)  01/18/18 175 lb (79.4 kg)  12/30/17 175 lb 12.8 oz (79.7 kg)      ASSESSMENT AND PLAN:  # Chronic diastolic heart failure:  Ms. Pha grade 2 diastolic dysfunction on echo and evidence of volume overload.  It is unclear what is because this.  She has no hypertension and no CKD.  She is not old enough to attribute this to age alone.  I am concerned that she may have an infiltrative cardiomyopathy.  She had frequent PVCs and has diastolic dysfunction.  We will get a cardiac MRI to better evaluate.  # Exertional dyspnea: # Family history of premature CAD:  Exertional symptoms have improved.  Lexiscan Myoview was negative for ischemia. Continue Lasix and check a basic metabolic panel today.   Current medicines are reviewed at length with the patient today.  The patient does not have concerns regarding medicines.  The following changes have been made:  no change  Labs/ tests ordered today include:   Orders Placed This Encounter    Procedures  . MR Card Morphology Wo/W Cm  . Basic metabolic panel  . EKG 12-Lead     Disposition:   FU with Mikaila Grunert C. Duke Salvia, MD, [facility] in 6 months.      Signed, Rorey Hodges C. Duke Salvia, MD, [facility]  01/27/2018 12:49 PM    [facility]

## 2018-01-28 LAB — BASIC METABOLIC PANEL
BUN/Creatinine Ratio: 20 (ref 9–23)
BUN: 14 mg/dL (ref 6–24)
CALCIUM: 9.3 mg/dL (ref 8.7–10.2)
CHLORIDE: 102 mmol/L (ref 96–106)
CO2: 26 mmol/L (ref 20–29)
Creatinine, Ser: 0.69 mg/dL (ref 0.57–1.00)
GFR calc non Af Amer: 98 mL/min/{1.73_m2} (ref >59)
GFR, EST AFRICAN AMERICAN: 113 mL/min/{1.73_m2} (ref >59)
Glucose: 88 mg/dL (ref 65–99)
Potassium: 4.3 mmol/L (ref 3.5–5.2)
Sodium: 142 mmol/L (ref 134–144)

## 2018-02-01 ENCOUNTER — Ambulatory Visit (HOSPITAL_COMMUNITY): Payer: BLUE CROSS/BLUE SHIELD

## 2018-02-16 ENCOUNTER — Ambulatory Visit (HOSPITAL_COMMUNITY): Payer: BLUE CROSS/BLUE SHIELD

## 2018-03-01 ENCOUNTER — Ambulatory Visit (HOSPITAL_COMMUNITY)
Admission: RE | Admit: 2018-03-01 | Discharge: 2018-03-01 | Disposition: A | Discharge status: Home / Self Care | Payer: BLUE CROSS/BLUE SHIELD | Source: Ambulatory Visit | Attending: Cardiovascular Disease | Admitting: Cardiovascular Disease

## 2018-03-01 DIAGNOSIS — I5031 Acute diastolic (congestive) heart failure: Secondary | ICD-10-CM | POA: Diagnosis present

## 2018-03-01 DIAGNOSIS — I429 Cardiomyopathy, unspecified: Secondary | ICD-10-CM | POA: Diagnosis not present

## 2018-03-01 DIAGNOSIS — I517 Cardiomegaly: Secondary | ICD-10-CM | POA: Diagnosis not present

## 2018-03-01 MED ORDER — GADOBENATE DIMEGLUMINE 529 MG/ML IV SOLN
24.0000 mL | Freq: Once | INTRAVENOUS | Status: AC | PRN
  Administered 2018-03-01: 24 mL via INTRAVENOUS

## 2018-05-12 ENCOUNTER — Other Ambulatory Visit: Payer: Self-pay | Admitting: Cardiovascular Disease

## 2018-07-27 ENCOUNTER — Other Ambulatory Visit: Payer: Self-pay | Admitting: Nurse Practitioner

## 2018-07-27 DIAGNOSIS — Z780 Asymptomatic menopausal state: Secondary | ICD-10-CM

## 2018-07-28 ENCOUNTER — Other Ambulatory Visit: Payer: Self-pay | Admitting: Nurse Practitioner

## 2018-07-28 DIAGNOSIS — Z1231 Encounter for screening mammogram for malignant neoplasm of breast: Secondary | ICD-10-CM

## 2018-08-03 ENCOUNTER — Ambulatory Visit (INDEPENDENT_AMBULATORY_CARE_PROVIDER_SITE_OTHER): Payer: BLUE CROSS/BLUE SHIELD | Admitting: Cardiovascular Disease

## 2018-08-03 ENCOUNTER — Encounter: Payer: Self-pay | Admitting: Cardiovascular Disease

## 2018-08-03 VITALS — BP 118/72 | HR 74 | Ht 68.0 in | Wt 169.0 lb

## 2018-08-03 DIAGNOSIS — I5032 Chronic diastolic (congestive) heart failure: Secondary | ICD-10-CM

## 2018-08-03 DIAGNOSIS — R0602 Shortness of breath: Secondary | ICD-10-CM | POA: Diagnosis not present

## 2018-08-03 NOTE — Progress Notes (Signed)
Cardiology Office Note   Date:  08/03/2018   ID:  Yvonne [name], Yvonne [name] 05/18/1961, MRN [id number]  PCP:  [place], Julie M, NP  Cardiologist:   Chilton Si, MD   No chief complaint on file.    History of Present Illness: Yvonne [name] is a 57 y.o. female with chronic diastolic heart failure here for follow-up.  She was initially seen 12/2017 for evaluation of shortness of breath.  Yvonne [name] father had an MI at age 68 and then died of a second MI at age 58.  She has tried to be proactive about her health and started biking in college.  She rides her bike over 1,000 miles per year.  In the past she had no exertional symptoms.  Over the preceding 4 to 6 months she noted increasing dyspnea on exertion and was unable to exert herself as she had in the past.  She was referred for a coronary CT-A and an echocardiogram.She was unable to wait for the coronary CT-need to be performed so she had a [employer] 12/2017 that revealed LVEF 56% and no ischemia.  She also had an echo that revealed LVEF 60 to 65% with grade 2 diastolic dysfunction.  Her IVC was dilated and did not collapse indicating a right atrial pressure of at least 15 mmHg.    After her echo findings Yvonne [name] was started on Lasix.  After 2 days she noted significant improvement in her breathing.  Since her last appointment she continues to have symptoms of exertional dyspnea.  She cannot keep up when riding her bike with friends.  She continues to maintain an active lifestyle and hiked 8 to 10 miles while in [place] recently.  However she feels like her functional capacity is just not what it used to be.  She has not had any lower extremity edema, orthopnea, or PND recently.  She does occasionally feel her palpitations.  It is not necessarily with exertion.  She notes that she does not deal with stressful situations as well as she used to.  Overall she has not been feeling anxious lately.  Since her last appointment she had a cardiac MRI  02/2018 that revealed LVEF 64%.  There is no evidence of late gadolinium enhancement.  Past Medical History:  Diagnosis Date  . Chronic diastolic heart failure (HCC) 01/27/2018  . Exertional dyspnea 12/30/2017  . Family history of premature CAD 12/30/2017   Father first MI at age 67, died at age 65.   Marland Kitchen PVC's (premature ventricular contractions) 12/30/2017    Past Surgical History:  Procedure Laterality Date  . ABDOMINAL HYSTERECTOMY     uterus cysts 2009  . APPENDECTOMY    . HERNIA REPAIR    . lymph node removal     1989     Current Outpatient Medications  Medication Sig Dispense Refill  . ASHWAGANDHA PO Take 470 mg by mouth daily.    Marland Kitchen aspirin EC 81 MG tablet Take 81 mg by mouth daily.    . Calcium Carbonate-Vit D-Min (CALCIUM 1200 PO) Take 1 capsule by mouth daily.    . Coenzyme Q10 (CO Q 10) 100 MG CAPS Take 1 tablet by mouth daily.    . furosemide (LASIX) 20 MG tablet TAKE TABLET BY MOUTH DAILY 30 tablet 6  . RHODIOLA ROSEA PO Take 700 mg by mouth daily.     No current facility-administered medications for this visit.     Allergies:   Patient has no known  allergies.    Social History:  The patient  reports that she has never smoked. She has never used smokeless tobacco. She reports that she drinks about 1.0 - 2.0 standard drinks of alcohol per week.   Family History:  The patient's family history includes Alzheimer's disease in her paternal grandmother; COPD in her maternal grandfather; Heart attack in her father; Liver disease in her mother.    ROS:  Please see the history of present illness.   Otherwise, review of systems are positive for none.   All other systems are reviewed and negative.    PHYSICAL EXAM: VS:  BP 118/72   Pulse 74   Ht 5\' 8"  (1.727 m)   Wt 169 lb (76.7 kg)   BMI 25.70 kg/m  , BMI Body mass index is 25.7 kg/m. GENERAL:  Well appearing HEENT: Pupils equal round and reactive, fundi not visualized, oral mucosa unremarkable NECK:  No jugular venous  distention, waveform within normal limits, carotid upstroke brisk and symmetric, no bruits LUNGS:  Clear to auscultation bilaterally HEART:  RRR.  PMI not displaced or sustained,S1 and S2 within normal limits, no S3, no S4, no clicks, no rubs, no murmurs ABD:  Flat, positive bowel sounds normal in frequency in pitch, no bruits, no rebound, no guarding, no midline pulsatile mass, no hepatomegaly, no splenomegaly EXT:  2 plus pulses throughout, no edema, no cyanosis no clubbing SKIN:  No rashes no nodules NEURO:  Cranial nerves II through XII grossly intact, motor grossly intact throughout PSYCH:  Cognitively intact, oriented to person place and time   EKG:  EKG is ordered today. The ekg ordered 12/30/17 demonstrates sinus rhythm.  Rate 65 bpm.  Incomplete RBBB.  One PVC.  01/27/17: Sinus rhythm.  Rate 68 bpm.  Incomplete RBBB.  QTc 482.  08/03/18: Sinus rhythm.  Rate 74 bpm.  Ventricular bigeminy.     Recent Labs: 12/30/2017: ALT 14; Hemoglobin 13.6; Magnesium 1.8; Platelets 262; TSH 1.910 01/27/2018: BUN 14; Creatinine, Ser 0.69; Potassium 4.3; Sodium 142    Lipid Panel No results found for: CHOL, TRIG, HDL, CHOLHDL, VLDL, LDLCALC, LDLDIRECT    Wt Readings from Last 3 Encounters:  08/03/18 169 lb (76.7 kg)  01/27/18 174 lb 6.4 oz (79.1 kg)  01/18/18 175 lb (79.4 kg)      ASSESSMENT AND PLAN:  # Chronic diastolic heart failure: # Exertional dyspnea: Yvonne [name] grade 2 diastolic dysfunction on echo.  Her LA is moderately enlarged.  She is euvolemic on exam.  It is very unclear to me why she has diastolic dysfunction.  She is not hypertensive and has no kidney disease.  She is also very physically active.  Cardiac MRI was negative for any infiltrative cardiomyopathies.  She continues to have exertional dyspnea.  Stress testing was normal.  We will get a cardiopulmonary exercise test to better explain why she has exertional symptoms.  She did have multiple PVCs on exam today.  It is possible  that this is contributing.  She has been hypotensive, which would make starting a beta-blocker difficult.   # Family history of premature CAD:  Lexiscan Myoview was negative for ischemia.   Current medicines are reviewed at length with the patient today.  The patient does not have concerns regarding medicines.  The following changes have been made:  no change  Labs/ tests ordered today include:   Orders Placed This Encounter  Procedures  . Cardiopulmonary exercise test  . EKG 12-Lead     Disposition:  FU with Shylynn Bruning C. Duke Salvia, MD, [facility] in 2 months.      Signed, Miloh Alcocer C. Duke Salvia, MD, [facility]  08/03/2018 1:57 PM    [facility]

## 2018-08-03 NOTE — Patient Instructions (Addendum)
Medication Instructions:  Your physician recommends that you continue on your current medications as directed. Please refer to the Current Medication list given to you today.  If you need a refill on your cardiac medications before your next appointment, please call your pharmacy.   Lab work: NONE  Testing/Procedures: NONE   Follow-Up: At [facility], you and your health needs are our priority.  As part of our continuing mission to provide you with exceptional heart care, we have created designated Provider Care Teams.  These Care Teams include your primary Cardiologist (physician) and Advanced Practice Providers (APPs -  Physician Assistants and Nurse Practitioners) who all work together to provide you with the care you need, when you need it. You will need a follow up appointment in 6-8 weeks. You may see DR [facility]  or one of the following Advanced Practice Providers on your designated Care Team:   Corine Shelter, PA-C Judy Pimple, [place] . Marjie Skiff, PA-C   Cardiopulmonary Exercise Stress Test Cardiopulmonary exercise testing (CPET) is a test that checks how your heart and lungs react to exercise. This is called your exercise capacity. During this test, you will walk or run on a treadmill or pedal on a stationary bike while tests are done on your heart and lungs. You may have this test to:  See why you are short of breath.  Check for exercise intolerance.  See how your lungs work.  See how your heart works.  Check for how you are responding to a heart or lung rehabilitation program.  See if you have a heart or lung problem.  See if you are healthy enough to have surgery.  What happens before the procedure?  Follow instructions from your doctor about what you cannot eat or drink.  Ask your doctor about changing or stopping your normal medicines. This is important if you take diabetes medicines or blood thinners.  Wear loose, comfortable clothing and shoes.  If  you use an inhaler, bring it with you to the test. What happens during the procedure?  A blood pressure cuff will be placed on your arm.  Several stick-on patches (electrodes) will be placed on your chest. They will be attached to an electrocardiogram (EKG) machine.  A clip-on monitor that measures the amount of oxygen in your blood will be placed on your finger (pulse oximeter).  A clip will be placed on your nose and a mouthpiece will be placed in your mouth. This may be held in place with a headpiece. You will breathe through the mouthpiece during the test.  You will be asked to start exercising. You will be closely watched while you exercise.  The amount of effort for your exercise will be gradually increased.  During exercise, the test will measure: ? Your heart rate. ? Your heart rhythm. ? Your oxygen blood level. ? The amount of oxygen and carbon dioxide that you breathe out.  The test will end when: ? You have finished the test. ? You have reached your maximum ability to exercise. ? You have chest or leg pain, dizziness, or shortness of breath. The procedure may vary among doctors and hospitals. What happens after the procedure?  Your blood pressure and EKG will be checked to watch how you recover from the test. This information is not intended to replace advice given to you by your health care provider. Make sure you discuss any questions you have with your health care provider. Document Released: 09/02/2009 Document Revised: 02/04/2016 Document Reviewed:  07/29/2015 Elsevier Interactive Patient Education  [employer].

## 2018-08-10 ENCOUNTER — Ambulatory Visit (HOSPITAL_COMMUNITY): Payer: BLUE CROSS/BLUE SHIELD | Attending: Cardiovascular Disease

## 2018-08-10 DIAGNOSIS — R0602 Shortness of breath: Secondary | ICD-10-CM

## 2018-08-19 ENCOUNTER — Telehealth: Payer: Self-pay | Admitting: *Deleted

## 2018-08-19 DIAGNOSIS — I493 Ventricular premature depolarization: Secondary | ICD-10-CM

## 2018-08-19 NOTE — Telephone Encounter (Signed)
Referral placed and message sent to scheduling to arrange             Burnell BlanksMelinda B Lane Eland, LPN To Beverly SessionsLisa R Darr Sent and Delivered 08/19/2018 3:56 PM  Last Read in MyChart Not Read  Dr Duke Salviaandolph would like for you to see one of our Electrophysiologist regarding the PVC's. I have placed referral and [facility] will be calling you.  Juliette AlcideMelinda   Previous Messages    Visit Follow-Up Question   From Beverly SessionsLisa R Swindle To Chilton Siiffany [place], MD Sent 08/16/2018 8:29 AM  I seem to be having more PVC's, especially in the morning time. 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 I have to adjust my breathing, which does not seem to help as well as it used to. I can just walk across the clinic and my heart rate races to over 110 and it seems thready and not consistent. I don't have a follow up with you until 12/4 (to discuss my CPX test). Is a 24 hr Halter monitor indicated next? I am weary about medications. Are there anti-arrhythmic drugs instead of beta blockers, with less side effects? Just wondering...  Thanks,

## 2018-08-30 NOTE — Telephone Encounter (Signed)
Appointment with Dr Johney FrameAllred 09/12/18

## 2018-09-07 ENCOUNTER — Encounter: Payer: Self-pay | Admitting: Cardiovascular Disease

## 2018-09-07 ENCOUNTER — Ambulatory Visit (INDEPENDENT_AMBULATORY_CARE_PROVIDER_SITE_OTHER): Payer: BLUE CROSS/BLUE SHIELD | Admitting: Cardiovascular Disease

## 2018-09-07 VITALS — BP 132/84 | HR 74 | Ht 68.0 in | Wt 164.0 lb

## 2018-09-07 DIAGNOSIS — I493 Ventricular premature depolarization: Secondary | ICD-10-CM

## 2018-09-07 DIAGNOSIS — R0602 Shortness of breath: Secondary | ICD-10-CM

## 2018-09-07 NOTE — Patient Instructions (Addendum)
Medication Instructions:  Your physician recommends that you continue on your current medications as directed. Please refer to the Current Medication list given to you today.  If you need a refill on your cardiac medications before your next appointment, please call your pharmacy.   Lab work: NONE  Testing/Procedures: 7 DAY ZIO MONITOR   Follow-Up: At [facility], you and your health needs are our priority.  As part of our continuing mission to provide you with exceptional heart care, we have created designated Provider Care Teams.  These Care Teams include your primary Cardiologist (physician) and Advanced Practice Providers (APPs -  Physician Assistants and Nurse Practitioners) who all work together to provide you with the care you need, when you need it. You will need a follow up appointment in 6-8  weeks.You may see DR [facility] or one of the following Advanced Practice Providers on your designated Care Team:   Corine ShelterLuke Kilroy, [place]-C  KEEP APPOINTMENT WITH DR Johney FrameALLRED

## 2018-09-07 NOTE — Progress Notes (Signed)
Cardiology Office Note   Date:  09/07/2018   ID:  Yvonne [name] Apr 05, 1961, MRN [id number]  PCP:  [place], Julie M, NP  Cardiologist:   Chilton Si, MD   No chief complaint on file.    History of Present Illness: Yvonne [name] is a 57 y.o. female with chronic diastolic heart failure here for follow-up.  She was initially seen 12/2017 for evaluation of shortness of breath.  Yvonne [name] father had an MI at age 5 and then died of a second MI at age 29.  She has tried to be proactive about her health and started biking in college.  She rides her bike over 1,000 miles per year.  In the past she had no exertional symptoms.  Over the preceding 4 to 6 months she noted increasing dyspnea on exertion and was unable to exert herself as she had in the past.  She was referred for a coronary CT-A and an echocardiogram.She was unable to wait for the coronary CT-need to be performed so she had a [employer] 12/2017 that revealed LVEF 56% and no ischemia.  She also had an echo that revealed LVEF 60 to 65% with grade 2 diastolic dysfunction.  Her IVC was dilated and did not collapse indicating a right atrial pressure of at least 15 mmHg.    After her echo findings Yvonne [name] was started on Lasix.  After 2 days she noted significant improvement in her breathing.  Since her last appointment she continues to have symptoms of exertional dyspnea.  She cannot keep up when riding her bike with friends.  She continues to maintain an active lifestyle and hiked 8 to 10 miles while in [place] recently.  However she feels like her functional capacity is just not what it used to be.  She has not had any lower extremity edema, orthopnea, or PND recently.  She does occasionally feel her palpitations.  It is not necessarily with exertion.  She notes that she does not deal with stressful situations as well as she used to.  Overall she has not been feeling anxious lately.  Since her last appointment she had a cardiac MRI  02/2018 that revealed LVEF 64%.  There was no evidence of late gadolinium enhancement.  Since her last appointment Yvonne [name] continues to feel poorly.  She has good days and bad days.  The last couple days she has been feeling well but last week she felt poorly for several days.  She notes that her heart rate is quite irregular during these times.  She is scheduled to see Dr. Johney Frame next week.  The day she had her CPX she was actually feeling pretty well.  She thinks that she could have exercised for another minute or so but the test was stopped.  She has not experienced any lower extremity edema, orthopnea, or PND.  She denies any chest pain.  Her CPX showed that she had excellent cardiopulmonary capacity.  PFTs were within normal limits.  There is a question of whether she had mildly decreased oxygen saturation (91%) at peak exercise, but this was thought to be artifactual.    Past Medical History:  Diagnosis Date  . Chronic diastolic heart failure (HCC) 01/27/2018  . Exertional dyspnea 12/30/2017  . Family history of premature CAD 12/30/2017   Father first MI at age 62, died at age 34.   Marland Kitchen PVC's (premature ventricular contractions) 12/30/2017    Past Surgical History:  Procedure Laterality Date  .  ABDOMINAL HYSTERECTOMY     uterus cysts 2009  . APPENDECTOMY    . HERNIA REPAIR    . lymph node removal     1989     Current Outpatient Medications  Medication Sig Dispense Refill  . ASHWAGANDHA PO Take 470 mg by mouth daily.    Marland Kitchen. aspirin EC 81 MG tablet Take 81 mg by mouth daily.    . Calcium Carbonate-Vit D-Min (CALCIUM 1200 PO) Take 1 capsule by mouth daily.    . Coenzyme Q10 (CO Q 10) 100 MG CAPS Take 1 tablet by mouth daily.    . furosemide (LASIX) 20 MG tablet TAKE TABLET BY MOUTH DAILY 30 tablet 6  . RHODIOLA ROSEA PO Take 700 mg by mouth daily.     No current facility-administered medications for this visit.     Allergies:   Patient has no known allergies.    Social History:  The  patient  reports that she has never smoked. She has never used smokeless tobacco. She reports that she drinks about 1.0 - 2.0 standard drinks of alcohol per week.   Family History:  The patient's family history includes Alzheimer's disease in her paternal grandmother; COPD in her maternal grandfather; Heart attack in her father; Liver disease in her mother.    ROS:  Please see the history of present illness.   Otherwise, review of systems are positive for none.   All other systems are reviewed and negative.    PHYSICAL EXAM: VS:  BP 132/84   Pulse 74   Ht 5\' 8"  (1.727 m)   Wt 164 lb (74.4 kg)   BMI 24.94 kg/m  , BMI Body mass index is 24.94 kg/m. GENERAL:  Well appearing HEENT: Pupils equal round and reactive, fundi not visualized, oral mucosa unremarkable NECK:  No jugular venous distention, waveform within normal limits, carotid upstroke brisk and symmetric, no bruits LUNGS:  Clear to auscultation bilaterally HEART:  RRR.  PMI not displaced or sustained,S1 and S2 within normal limits, no S3, no S4, no clicks, no rubs, no murmurs ABD:  Flat, positive bowel sounds normal in frequency in pitch, no bruits, no rebound, no guarding, no midline pulsatile mass, no hepatomegaly, no splenomegaly EXT:  2 plus pulses throughout, no edema, no cyanosis no clubbing SKIN:  No rashes no nodules NEURO:  Cranial nerves II through XII grossly intact, motor grossly intact throughout PSYCH:  Cognitively intact, oriented to person place and time   EKG:  EKG is not ordered today. The ekg ordered 12/30/17 demonstrates sinus rhythm.  Rate 65 bpm.  Incomplete RBBB.  One PVC.  01/27/17: Sinus rhythm.  Rate 68 bpm.  Incomplete RBBB.  QTc 482.  08/03/18: Sinus rhythm.  Rate 74 bpm.  Ventricular bigeminy.    CPX 07/2018:  Excellent functional capacity with mild HTN response to exercise. No obvious cardio-pulmonary limitation. There were frequent PVCs at rest which abated with exercise. At peak exercise there was a  question of a mild drop in O2 saturations to 91% but this may have been artifactual. There were no ST-T changes with exercise. The ventilatory threshold was very high suggesting excellent exercise training.   Recent Labs: 12/30/2017: ALT 14; Hemoglobin 13.6; Magnesium 1.8; Platelets 262; TSH 1.910 01/27/2018: BUN 14; Creatinine, Ser 0.69; Potassium 4.3; Sodium 142    Lipid Panel No results found for: CHOL, TRIG, HDL, CHOLHDL, VLDL, LDLCALC, LDLDIRECT    Wt Readings from Last 3 Encounters:  09/07/18 164 lb (74.4 kg)  08/03/18 169  lb (76.7 kg)  01/27/18 174 lb 6.4 oz (79.1 kg)      ASSESSMENT AND PLAN:  # Chronic diastolic heart failure: # Exertional dyspnea: # PVCs: Ms. Elza grade 2 diastolic dysfunction on echo.  Her LA is moderately enlarged.  She is euvolemic on exam and has not improved significantly with low dose furosemide.  It is very unclear to me why she has diastolic dysfunction.  She is not hypertensive and has no kidney disease.  She is also very physically active.  Cardiac MRI was negative for any infiltrative cardiomyopathies.  She continues to have exertional dyspnea.  Stress testing was normal. CPX showed excellent cardiopulmonary capacity.  The only thing outlying is her PVCs.  We will get a 7-day ZIO monitor to better assess the quantity and associated with her symptoms.  She is already scheduled to see Dr. Johney Frame next week.  Would consider low dose beta blocker   # Family history of premature CAD:  Lexiscan Myoview was negative for ischemia.     Current medicines are reviewed at length with the patient today.  The patient does not have concerns regarding medicines.  The following changes have been made:  no change  Labs/ tests ordered today include:   Orders Placed This Encounter  Procedures  . LONG TERM MONITOR (3-14 DAYS)     Disposition:   FU with Pepe Mineau C. Duke Salvia, MD, [facility] in 6-8 weeks.    Signed, Gurley Climer C. Duke Salvia, MD, [facility]  09/07/2018 2:55 PM      [facility]

## 2018-09-08 ENCOUNTER — Ambulatory Visit (INDEPENDENT_AMBULATORY_CARE_PROVIDER_SITE_OTHER): Payer: BLUE CROSS/BLUE SHIELD

## 2018-09-08 DIAGNOSIS — I493 Ventricular premature depolarization: Secondary | ICD-10-CM | POA: Diagnosis not present

## 2018-09-12 ENCOUNTER — Ambulatory Visit (INDEPENDENT_AMBULATORY_CARE_PROVIDER_SITE_OTHER): Payer: BLUE CROSS/BLUE SHIELD | Admitting: Internal Medicine

## 2018-09-12 ENCOUNTER — Encounter: Payer: Self-pay | Admitting: Internal Medicine

## 2018-09-12 VITALS — BP 110/64 | HR 78 | Ht 68.0 in | Wt 171.0 lb

## 2018-09-12 DIAGNOSIS — R0609 Other forms of dyspnea: Secondary | ICD-10-CM | POA: Diagnosis not present

## 2018-09-12 DIAGNOSIS — I5032 Chronic diastolic (congestive) heart failure: Secondary | ICD-10-CM | POA: Diagnosis not present

## 2018-09-12 DIAGNOSIS — I493 Ventricular premature depolarization: Secondary | ICD-10-CM | POA: Diagnosis not present

## 2018-09-12 DIAGNOSIS — R0600 Dyspnea, unspecified: Secondary | ICD-10-CM

## 2018-09-12 MED ORDER — NADOLOL 20 MG PO TABS: 10.0000 mg | ORAL_TABLET | Freq: Every day | ORAL | 3 refills | Status: DC

## 2018-09-12 NOTE — Patient Instructions (Addendum)
Medication Instructions:  Your physician has recommended you make the following change in your medication:   1.  Start taking nadolol 10 mg --one tablet by mouth daily   Labwork: None ordered.  Testing/Procedures: None ordered.  Follow-Up: Your physician wants you to follow-up in: 6 weeks with Dr. Johney FrameAllred.     October 26, 2018 at 9:30 am.  Please arrive 15 minutes early.  Any Other Special Instructions Will Be Listed Below (If Applicable).  If you need a refill on your cardiac medications before your next appointment, please call your pharmacy.

## 2018-09-12 NOTE — Progress Notes (Signed)
Electrophysiology Office Note   Date:  09/12/2018   ID:  [name], DOB May 24, 1961, MRN [id number]  PCP:  [place], Julie M, NP  Cardiologist:  Dr Duke Salviaandolph Primary Electrophysiologist: Hillis RangeJames Kamera Dubas, MD    CC: PVCs   History of Present Illness: Yvonne [name] is a 57 y.o. female who presents today for electrophysiology evaluation.   She is referred by Dr Duke Salviaandolph for EP consultation regarding PVCs..   She is very active. She rides her bike over 1000 miles per year.  Over the past 6-12 months, she has noticed SOB with exertion.  She had myoview 4/19 which revealed EF 56% with no ischemia.  She has been treated for diastolic dysfunction.  She had cardiac MRI 6/19 which revealed EF 64%.  She notices palpitations.  These seem to wax and wane and have been found to be due to PVCs.  She is currently wearing a Zio patch.  Today, she denies symptoms of chest pain, orthopnea, PND, lower extremity edema, claudication, dizziness, presyncope, syncope, bleeding, or neurologic sequela. The patient is tolerating medications without difficulties and is otherwise without complaint today.    Past Medical History:  Diagnosis Date  . Chronic diastolic heart failure (HCC) 01/27/2018  . Exertional dyspnea 12/30/2017  . Family history of premature CAD 12/30/2017   Father first MI at age 57, died at age 57.   Marland Kitchen. PVC's (premature ventricular contractions) 12/30/2017   Past Surgical History:  Procedure Laterality Date  . ABDOMINAL HYSTERECTOMY     uterus cysts 2009  . APPENDECTOMY    . HERNIA REPAIR    . lymph node removal     1989     Current Outpatient Medications  Medication Sig Dispense Refill  . ASHWAGANDHA PO Take 470 mg by mouth daily.    Marland Kitchen. aspirin EC 81 MG tablet Take 81 mg by mouth daily.    . Calcium Carbonate-Vit D-Min (CALCIUM 1200 PO) Take 1 capsule by mouth daily.    . Coenzyme Q10 (CO Q 10) 100 MG CAPS Take 1 tablet by mouth daily.    . furosemide (LASIX) 20 MG tablet TAKE TABLET BY MOUTH  DAILY 30 tablet 6  . RHODIOLA ROSEA PO Take 700 mg by mouth daily.     No current facility-administered medications for this visit.     Allergies:   Patient has no known allergies.   Social History:  The patient  reports that she has never smoked. She has never used smokeless tobacco. She reports current alcohol use of about 1.0 - 2.0 standard drinks of alcohol per week.   Family History:  The patient's  family history includes Alzheimer's disease in her paternal grandmother; COPD in her maternal grandfather; Heart attack in her father; Liver disease in her mother.    ROS:  Please see the history of present illness.   All other systems are personally reviewed and negative.    PHYSICAL EXAM: VS:  BP 110/64   Pulse 78   Ht 5\' 8"  (1.727 m)   Wt 171 lb (77.6 kg)   SpO2 97%   BMI 26.00 kg/m  , BMI Body mass index is 26 kg/m. GEN: Well nourished, well developed, in no acute distress  HEENT: normal  Neck: no JVD, carotid bruits, or masses Cardiac: RRR with ectopy; no murmurs, rubs, or gallops,no edema  Respiratory:  clear to auscultation bilaterally, normal work of breathing GI: soft, nontender, nondistended, + BS MS: no deformity or atrophy  Skin: warm and dry  Neuro:  Strength and sensation are intact Psych: euthymic mood, full affect  EKG:  EKG is ordered today. The ekg ordered today is personally reviewed and shows sinus rhythm with occasional PVCs   Recent Labs: 12/30/2017: ALT 14; Hemoglobin 13.6; Magnesium 1.8; Platelets 262; TSH 1.910 01/27/2018: BUN 14; Creatinine, Ser 0.69; Potassium 4.3; Sodium 142  personally reviewed   Lipid Panel  No results found for: CHOL, TRIG, HDL, CHOLHDL, VLDL, LDLCALC, LDLDIRECT personally reviewed   Wt Readings from Last 3 Encounters:  09/12/18 171 lb (77.6 kg)  09/07/18 164 lb (74.4 kg)  08/03/18 169 lb (76.7 kg)      Other studies personally reviewed: Additional studies/ records that were reviewed today include: Dr Leonides Sake  notes , prior stress test and echo Review of the above records today demonstrates: as above,  She is wearing an event monitor   ASSESSMENT AND PLAN:  1.  PVCs Her PVCs are noted to be of a LBB inferior axis with precordial transition at V3. She has outflow tract PVCs which are likely responsible for symptoms of her symptoms.   We discussed options of lifestyle modification, beta blockers, flecainide, and ablation.  Her lifestyle is already modified quite well.  She reports control of stress, anxiety and does not consume heavy ETOH, caffeine, preservatives or sodium. Start nadolol 10mg  daily.  This can be increased to 20mg  daily if needed. If her PVCs persist, we may try flecainide 50mg  BID   Follow-up:  Return in 6 weeks Follow-up with Dr Duke Salvia as scheduled  Current medicines are reviewed at length with the patient today.   The patient does not have concerns regarding her medicines.  The following changes were made today:  none  Labs/ tests ordered today include:  Orders Placed This Encounter  Procedures  . EKG 12-Lead     Signed, Hillis Range, MD  09/12/2018 11:50 AM     [facility] [address] [place] [address] [phone] (office) [phone] (fax)

## 2018-10-26 ENCOUNTER — Ambulatory Visit: Payer: BLUE CROSS/BLUE SHIELD | Admitting: Cardiovascular Disease

## 2018-10-26 ENCOUNTER — Ambulatory Visit: Payer: BLUE CROSS/BLUE SHIELD | Admitting: Internal Medicine

## 2018-12-14 ENCOUNTER — Ambulatory Visit
Admission: RE | Admit: 2018-12-14 | Discharge: 2018-12-14 | Disposition: A | Discharge status: Home / Self Care | Payer: BLUE CROSS/BLUE SHIELD | Source: Ambulatory Visit | Attending: Nurse Practitioner | Admitting: Nurse Practitioner

## 2018-12-14 ENCOUNTER — Other Ambulatory Visit: Payer: Self-pay

## 2018-12-14 DIAGNOSIS — Z780 Asymptomatic menopausal state: Secondary | ICD-10-CM

## 2018-12-14 DIAGNOSIS — Z1231 Encounter for screening mammogram for malignant neoplasm of breast: Secondary | ICD-10-CM

## 2018-12-23 MED ORDER — FUROSEMIDE 20 MG PO TABS: ORAL_TABLET | ORAL | 3 refills | Status: DC

## 2019-04-14 ENCOUNTER — Other Ambulatory Visit: Payer: Self-pay | Admitting: Cardiovascular Disease

## 2019-07-09 ENCOUNTER — Emergency Department (INDEPENDENT_AMBULATORY_CARE_PROVIDER_SITE_OTHER)
Admission: EM | Admit: 2019-07-09 | Discharge: 2019-07-09 | Disposition: A | Discharge status: Home / Self Care | Payer: BC Managed Care – PPO | Source: Home / Self Care

## 2019-07-09 ENCOUNTER — Other Ambulatory Visit: Payer: Self-pay

## 2019-07-09 ENCOUNTER — Encounter: Payer: Self-pay | Admitting: Family Medicine

## 2019-07-09 DIAGNOSIS — J069 Acute upper respiratory infection, unspecified: Secondary | ICD-10-CM

## 2019-07-09 NOTE — ED Triage Notes (Signed)
Here with poss URI sx's that started x2 days ago. Slight sore throat, nasal congestion and head congestion.

## 2019-07-09 NOTE — ED Provider Notes (Signed)
[facility]    CSN: [id number] Arrival date & time: 07/09/19  1522      History   Chief Complaint Chief Complaint  Patient presents with  . URI    HPI Yvonne [name] is a 58 y.o. female.   This is the initial [place] urgent care visit for this 58 year old woman who complains of sinus congestion.  She has had a mild sore throat as well.  She is taking ibuprofen and antihistamine to control the symptoms.  Patient has no loss of sense of smell, no cough, no shortness of breath, no rash or painful toes.  She has had no GI symptoms.  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     Past Medical History:  Diagnosis Date  . Chronic diastolic heart failure (HCC) 01/27/2018  . Exertional dyspnea 12/30/2017  . Family history of premature CAD 12/30/2017   Father first MI at age 68, died at age 41.   Marland Kitchen PVC's (premature ventricular contractions) 12/30/2017    Patient Active Problem List   Diagnosis Date Noted  . Chronic diastolic heart failure (HCC) 01/27/2018  . Family history of premature CAD 12/30/2017  . PVC's (premature ventricular contractions) 12/30/2017  . Exertional dyspnea 12/30/2017    Past Surgical History:  Procedure Laterality Date  . ABDOMINAL HYSTERECTOMY     uterus cysts 2009  . APPENDECTOMY    . HERNIA REPAIR    . lymph node removal     1989    OB History   No obstetric history on file.      Home Medications    Prior to Admission medications   Medication Sig Start Date End Date Taking? Authorizing Provider  ASHWAGANDHA PO Take 470 mg by mouth daily.    [provider]  aspirin EC 81 MG tablet Take 81 mg by mouth daily.    [provider]  Calcium Carbonate-Vit D-Min (CALCIUM 1200 PO) Take 1 capsule by mouth daily.    [provider]  Coenzyme Q10 (CO Q 10) 100 MG CAPS Take 1 tablet by mouth daily.    [provider]  furosemide (LASIX) 20 MG  tablet TAKE 1 TABLET BY MOUTH EVERY DAY 04/17/19   Chilton Si, MD  nadolol (CORGARD) 20 MG tablet Take 0.5 tablets (10 mg total) by mouth daily. 09/12/18   Allred, Fayrene Fearing, MD  RHODIOLA ROSEA PO Take 700 mg by mouth daily.    [provider]    Family History Family History  Problem Relation Age of Onset  . Liver disease Mother   . Heart attack Father   . COPD Maternal Grandfather   . Alzheimer's disease Paternal Grandmother     Social History Social History   Tobacco Use  . Smoking status: Never Smoker  . Smokeless tobacco: Never Used  Substance Use Topics  . Alcohol use: Yes    Alcohol/week: 1.0 - 2.0 standard drinks    Types: 1 - 2 Glasses of wine per week    Comment: weekly  . Drug use: Not on file     Allergies   Patient has no known allergies.   Review of Systems Review of Systems  HENT: Positive for congestion and sore throat.   All other systems reviewed and are negative.    Physical Exam Triage Vital Signs ED Triage Vitals  Enc Vitals Group     BP  Pulse      Resp      Temp      Temp src      SpO2      Weight      Height      Head Circumference      Peak Flow      Pain Score      Pain Loc      Pain Edu?      Excl. in [place]?    No data found.  Updated Vital Signs BP (!) 154/98 (BP Location: Left Arm)   Pulse 85   Temp 98.7 F (37.1 C) (Oral)   Ht 5\' 9"  (1.753 m)   Wt 77.9 kg   SpO2 99%   BMI 25.37 kg/m   Physical Exam Vitals signs and nursing note reviewed.  Constitutional:      Appearance: Normal appearance. She is normal weight.  HENT:     Nose: Nose normal.     Mouth/Throat:     Mouth: Mucous membranes are moist.     Pharynx: Oropharynx is clear.  Eyes:     Conjunctiva/sclera: Conjunctivae normal.  Neck:     Musculoskeletal: Normal range of motion and neck supple.  Pulmonary:     Effort: Pulmonary effort is normal.     Breath sounds: Normal breath sounds.  Musculoskeletal: Normal range of motion.  Skin:     General: Skin is warm.  Neurological:     General: No focal deficit present.     Mental Status: She is alert.  Psychiatric:        Mood and Affect: Mood normal.        Thought Content: Thought content normal.      UC Treatments / Results  Labs (all labs ordered are listed, but only abnormal results are displayed) Labs Reviewed - No data to display   COVID-19 test done here is negative  EKG   Radiology No results found.  Procedures Procedures (including critical care time)  Medications Ordered in UC Medications - No data to display  Initial Impression / Assessment and Plan / UC Course  I have reviewed the triage vital signs and the nursing notes.  Pertinent labs & imaging results that were available during my care of the patient were reviewed by me and considered in my medical decision making (see chart for details).    Final Clinical Impressions(s) / UC Diagnoses   Final diagnoses:  Acute upper respiratory infection     Discharge Instructions     Your COVID test is negative.    ED Prescriptions    None     I have reviewed the PDMP during this encounter.   Robyn Haber, MD 07/09/19 1615

## 2019-07-09 NOTE — Discharge Instructions (Addendum)
Your COVID test is negative

## 2019-10-08 ENCOUNTER — Other Ambulatory Visit: Payer: Self-pay | Admitting: Cardiovascular Disease

## 2019-11-07 ENCOUNTER — Other Ambulatory Visit: Payer: Self-pay | Admitting: Cardiovascular Disease

## 2019-11-08 ENCOUNTER — Other Ambulatory Visit: Payer: Self-pay | Admitting: Nurse Practitioner

## 2019-11-08 DIAGNOSIS — Z1231 Encounter for screening mammogram for malignant neoplasm of breast: Secondary | ICD-10-CM

## 2019-11-13 ENCOUNTER — Other Ambulatory Visit: Payer: Self-pay | Admitting: Cardiovascular Disease

## 2019-11-13 MED ORDER — FUROSEMIDE 20 MG PO TABS: 20.0000 mg | ORAL_TABLET | Freq: Every day | ORAL | 0 refills | Status: DC

## 2019-11-13 NOTE — Telephone Encounter (Signed)
*  STAT* If patient is at the pharmacy, call can be transferred to refill team.   1. Which medications need to be refilled? (please list name of each medication and dose if known) furosemide (LASIX) 20 MG tablet  2. Which pharmacy/location (including street and city if local pharmacy) is medication to be sent to? [id number] - [place], [place] - [address]  3. Do they need a 30 day or 90 day supply? 90  Patient has appt with Dr. Duke Salvia on 01/03/20

## 2019-12-20 ENCOUNTER — Other Ambulatory Visit: Payer: Self-pay

## 2019-12-20 ENCOUNTER — Ambulatory Visit
Admission: RE | Admit: 2019-12-20 | Discharge: 2019-12-20 | Disposition: A | Discharge status: Home / Self Care | Payer: PRIVATE HEALTH INSURANCE | Source: Ambulatory Visit | Attending: Nurse Practitioner | Admitting: Nurse Practitioner

## 2019-12-20 DIAGNOSIS — Z1231 Encounter for screening mammogram for malignant neoplasm of breast: Secondary | ICD-10-CM

## 2020-01-03 ENCOUNTER — Other Ambulatory Visit: Payer: Self-pay

## 2020-01-03 ENCOUNTER — Ambulatory Visit (INDEPENDENT_AMBULATORY_CARE_PROVIDER_SITE_OTHER): Payer: PRIVATE HEALTH INSURANCE | Admitting: Cardiovascular Disease

## 2020-01-03 ENCOUNTER — Encounter: Payer: Self-pay | Admitting: Cardiovascular Disease

## 2020-01-03 VITALS — BP 126/62 | HR 70 | Ht 68.0 in | Wt 156.0 lb

## 2020-01-03 DIAGNOSIS — I5032 Chronic diastolic (congestive) heart failure: Secondary | ICD-10-CM | POA: Diagnosis not present

## 2020-01-03 DIAGNOSIS — R0600 Dyspnea, unspecified: Secondary | ICD-10-CM | POA: Diagnosis not present

## 2020-01-03 DIAGNOSIS — I493 Ventricular premature depolarization: Secondary | ICD-10-CM | POA: Diagnosis not present

## 2020-01-03 DIAGNOSIS — Z8249 Family history of ischemic heart disease and other diseases of the circulatory system: Secondary | ICD-10-CM

## 2020-01-03 DIAGNOSIS — R0609 Other forms of dyspnea: Secondary | ICD-10-CM

## 2020-01-03 MED ORDER — FUROSEMIDE 20 MG PO TABS: ORAL_TABLET | ORAL | 3 refills | Status: DC

## 2020-01-03 NOTE — Patient Instructions (Signed)
Medication Instructions:  TAKE YOUR FUROSEMIDE Monday, Wednesday, AND Friday ONLY   *If you need a refill on your cardiac medications before your next appointment, please call your pharmacy*   Lab Work: NONE   Testing/Procedures: NONE  Follow-Up: AS NEEDED

## 2020-01-03 NOTE — Progress Notes (Signed)
Cardiology Office Note   Date:  01/03/2020   ID:  Yvonne, [name] 07-06-1961, MRN [id number]  PCP:  [place]ique, Julie M, NP  Cardiologist:   Skeet Latch, MD   No chief complaint on file.    History of Present Illness: Yvonne [name] is a 59 y.o. female with chronic diastolic heart failure here for follow-up.  She was initially seen 12/2017 for evaluation of shortness of breath.  [name] father had an MI at age 24 and then died of a second MI at age 60.  She has tried to be proactive about her health and started biking in college.  She rides her bike over 1,000 miles per year.  In the past she had no exertional symptoms.  She noted increasing dyspnea on exertion and was unable to exert herself as she had in the past.  She was referred for a coronary CT-A and an echocardiogram.  She was unable to wait for the coronary CT-need to be performed so she had a [employer] 12/2017 that revealed LVEF 56% and no ischemia.  She also had an echo that revealed LVEF 60 to 65% with grade 2 diastolic dysfunction.  Her IVC was dilated and did not collapse indicating a right atrial pressure of at least 15 mmHg.  She had a cardiac MRI 02/2018 that revealed LVEF 64%.  There was no evidence of late gadolinium enhancement.  After her echo findings [name] was started on Lasix.  After two days she noted significant improvement in her breathing.  She continued to have symptoms of exertional dyspnea.  She had a CPX that showed that she had excellent cardiopulmonary capacity.  PFTs were within normal limits.  There is a question of whether she had mildly decreased oxygen saturation (91%) at peak exercise, but this was thought to be artifactual.  She had frequent PVCs on an ambulatory monitor 08/2018.  She was referred to Dr. Rayann Heman and started on nadolol.  However she stopped it after 1 day.  Overall her symptoms have been much better and she has not noticed any PVCs.  She also denies exertional dyspnea.  She rides  her bike once or twice per week for 2 hours at a time and feels well.  She also started a dieting program and has lost 15 pounds.  She got her Covid vaccine and is feeling great.  She continues to take Lasix every day.  She denies any lower extremity edema, orthopnea, or PND.   Past Medical History:  Diagnosis Date  . Chronic diastolic heart failure ([place]) 01/27/2018  . Exertional dyspnea 12/30/2017  . Family history of premature CAD 12/30/2017   Father first MI at age 76, died at age 93.   Marland Kitchen PVC's (premature ventricular contractions) 12/30/2017    Past Surgical History:  Procedure Laterality Date  . ABDOMINAL HYSTERECTOMY     uterus cysts 2009  . APPENDECTOMY    . HERNIA REPAIR    . lymph node removal     1989     Current Outpatient Medications  Medication Sig Dispense Refill  . Calcium Carbonate-Vit D-Min (CALCIUM 1200 PO) Take 1 capsule by mouth daily.    . Coenzyme Q10 (CO Q 10) 100 MG CAPS Take 1 tablet by mouth daily.    . furosemide (LASIX) 20 MG tablet TAKE 1 TABLET Monday, Wednesday, AND Friday ONLY 40 tablet 3   No current facility-administered medications for this visit.    Allergies:   Patient has  no known allergies.    Social History:  The patient  reports that she has never smoked. She has never used smokeless tobacco. She reports current alcohol use of about 1.0 - 2.0 standard drinks of alcohol per week.   Family History:  The patient's family history includes Alzheimer's disease in her paternal grandmother; COPD in her maternal grandfather; Heart attack in her father; Liver disease in her mother.    ROS:  Please see the history of present illness.   Otherwise, review of systems are positive for none.   All other systems are reviewed and negative.    PHYSICAL EXAM: VS:  BP 126/62   Pulse 70   Ht 5\' 8"  (1.727 m)   Wt 156 lb (70.8 kg)   BMI 23.72 kg/m  , BMI Body mass index is 23.72 kg/m. GENERAL:  Well appearing HEENT: Pupils equal round and reactive, fundi  not visualized, oral mucosa unremarkable NECK:  No jugular venous distention, waveform within normal limits, carotid upstroke brisk and symmetric, no bruits LUNGS:  Clear to auscultation bilaterally HEART:  RRR.  PMI not displaced or sustained,S1 and S2 within normal limits, no S3, no S4, no clicks, no rubs, no murmurs ABD:  Flat, positive bowel sounds normal in frequency in pitch, no bruits, no rebound, no guarding, no midline pulsatile mass, no hepatomegaly, no splenomegaly EXT:  2 plus pulses throughout, no edema, no cyanosis no clubbing SKIN:  No rashes no nodules NEURO:  Cranial nerves II through XII grossly intact, motor grossly intact throughout PSYCH:  Cognitively intact, oriented to person place and time   EKG:  EKG is ordered today. The ekg ordered 12/30/17 demonstrates sinus rhythm.  Rate 65 bpm.  Incomplete RBBB.  One PVC.  01/27/17: Sinus rhythm.  Rate 68 bpm.  Incomplete RBBB.  QTc 482.  08/03/18: Sinus rhythm.  Rate 74 bpm.  Ventricular bigeminy.   01/03/20: Sinus rhythm.  Rate 70 bpm..  RBBB.    CPX 07/2018:  Excellent functional capacity with mild HTN response to exercise. No obvious cardio-pulmonary limitation. There were frequent PVCs at rest which abated with exercise. At peak exercise there was a question of a mild drop in O2 saturations to 91% but this may have been artifactual. There were no ST-T changes with exercise. The ventilatory threshold was very high suggesting excellent exercise training.   Recent Labs: No results found for requested labs within last 8760 hours.    Lipid Panel No results found for: CHOL, TRIG, HDL, CHOLHDL, VLDL, LDLCALC, LDLDIRECT    Wt Readings from Last 3 Encounters:  01/03/20 156 lb (70.8 kg)  07/09/19 171 lb 12.8 oz (77.9 kg)  09/12/18 171 lb (77.6 kg)      ASSESSMENT AND PLAN:  # Chronic diastolic heart failure: # Exertional dyspnea: # PVCs: [name] grade 2 diastolic dysfunction on echo.  Her LA is moderately enlarged.  She  has been very stable and has no symptoms at this time.  She would like to try and take fewer medications given that she is doing so well.  She will reduce Lasix to Monday Wednesday Friday.  She never took the nadolol and her PVCs have been controlled.  There is no evidence of PVCs on her EKG today.  Continue to monitor.  # Family history of premature CAD:  Lexiscan Myoview was negative for ischemia.  She is going to stop taking aspirin   Current medicines are reviewed at length with the patient today.  The patient does not  have concerns regarding medicines.  The following changes have been made:  Lasix MWF  Labs/ tests ordered today include:   No orders of the defined types were placed in this encounter.    Disposition:   FU with Elster Corbello C. Duke Salvia, MD, [facility] as needed.    Signed, Parthena Fergeson C. Duke Salvia, MD, [facility]  01/03/2020 5:15 PM    [facility]

## 2020-02-09 ENCOUNTER — Other Ambulatory Visit: Payer: Self-pay

## 2020-02-09 MED ORDER — FUROSEMIDE 20 MG PO TABS: ORAL_TABLET | ORAL | 1 refills | Status: DC

## 2020-03-24 ENCOUNTER — Emergency Department (INDEPENDENT_AMBULATORY_CARE_PROVIDER_SITE_OTHER): Payer: PRIVATE HEALTH INSURANCE

## 2020-03-24 ENCOUNTER — Emergency Department (INDEPENDENT_AMBULATORY_CARE_PROVIDER_SITE_OTHER)
Admission: EM | Admit: 2020-03-24 | Discharge: 2020-03-24 | Disposition: A | Discharge status: Home / Self Care | Payer: PRIVATE HEALTH INSURANCE | Source: Home / Self Care

## 2020-03-24 ENCOUNTER — Other Ambulatory Visit: Payer: Self-pay

## 2020-03-24 ENCOUNTER — Encounter: Payer: Self-pay | Admitting: Emergency Medicine

## 2020-03-24 DIAGNOSIS — Y9301 Activity, walking, marching and hiking: Secondary | ICD-10-CM | POA: Diagnosis not present

## 2020-03-24 DIAGNOSIS — S93602A Unspecified sprain of left foot, initial encounter: Secondary | ICD-10-CM

## 2020-03-24 DIAGNOSIS — W1849XA Other slipping, tripping and stumbling without falling, initial encounter: Secondary | ICD-10-CM | POA: Diagnosis not present

## 2020-03-24 DIAGNOSIS — S9032XA Contusion of left foot, initial encounter: Secondary | ICD-10-CM

## 2020-03-24 MED ORDER — HYDROCODONE-ACETAMINOPHEN 5-325 MG PO TABS: 1.0000 | ORAL_TABLET | Freq: Four times a day (QID) | ORAL | 0 refills | Status: DC | PRN

## 2020-03-24 NOTE — Discharge Instructions (Signed)
Weightbearing as tolerated. Use your crutches and an ace wrap or boot for comfort.   You may take 500mg  acetaminophen every 4-6 hours or in combination with ibuprofen 400-600mg  every 6-8 hours as needed for pain and inflammation.  Norco/Vicodin (hydrocodone-acetaminophen) is a narcotic pain medication, do not combine these medications with others containing tylenol. While taking, do not drink alcohol, drive, or perform any other activities that requires focus while taking these medications.   Call to schedule a follow up appointment with Sports Medicine or an Orthopedist if not improving in 1-2 weeks with conservative treatment at home. Additional imaging may be indicated.

## 2020-03-24 NOTE — ED Triage Notes (Signed)
Twisted & rolled left foot at 2030 while hiking last night & had to walk a mile back to the car Presents today with a compression wrap and crutches  Aleve last night - refused meds in triage  Elevated and iced last night Covid vaccine Pfizer (2/21)

## 2020-03-24 NOTE — ED Provider Notes (Signed)
[name]    CSN: [id number] Arrival date & time: 03/24/20  1258      History   Chief Complaint Chief Complaint  Patient presents with  . Foot Injury    left    HPI Yvonne [name] is a 59 y.o. female.   HPI  Yvonne [name] is a 59 y.o. female presenting to UC with c/o sudden onset Left foot pain, swelling and bruising that started after she rolled her ankle around 2030 last night walking her dog.  Pt reports walking a mile back to her car after rolling her foot.  She has used compression wrap, ice and elevation. Took Aleve last night. Mild relief. Pt wants to make sure she did not break her foot. No prior fracture to same foot. Denies ankle pain or other injuries.   Past Medical History:  Diagnosis Date  . Chronic diastolic heart failure ([place]) 01/27/2018  . Exertional dyspnea 12/30/2017  . Family history of premature CAD 12/30/2017   Father first MI at age 12, died at age 23.   Marland Kitchen PVC's (premature ventricular contractions) 12/30/2017    Patient Active Problem List   Diagnosis Date Noted  . Chronic diastolic heart failure ([place]) 01/27/2018  . Family history of premature CAD 12/30/2017  . PVC's (premature ventricular contractions) 12/30/2017  . Exertional dyspnea 12/30/2017    Past Surgical History:  Procedure Laterality Date  . ABDOMINAL HYSTERECTOMY     uterus cysts 2009  . APPENDECTOMY    . HERNIA REPAIR    . lymph node removal     1989    OB History   No obstetric history on file.      Home Medications    Prior to Admission medications   Medication Sig Start Date End Date Taking? Authorizing Provider  Calcium Carbonate-Vit D-Min (CALCIUM 1200 PO) Take 1 capsule by mouth daily.   Yes [provider]  Coenzyme Q10 (CO Q 10) 100 MG CAPS Take 1 tablet by mouth daily.   Yes [provider]  furosemide (LASIX) 20 MG tablet TAKE 1 TABLET Monday, Wednesday, AND Friday ONLY 02/09/20   Skeet Latch, MD  HYDROcodone-acetaminophen  (NORCO/VICODIN) 5-325 MG tablet Take 1 tablet by mouth every 6 (six) hours as needed for moderate pain or severe pain. 03/24/20   Noe Gens, PA-C    Family History Family History  Problem Relation Age of Onset  . Liver disease Mother   . Heart attack Father   . COPD Maternal Grandfather   . Alzheimer's disease Paternal Grandmother     Social History Social History   Tobacco Use  . Smoking status: Never Smoker  . Smokeless tobacco: Never Used  Substance Use Topics  . Alcohol use: Yes    Alcohol/week: 1.0 - 2.0 standard drink    Types: 1 - 2 Glasses of wine per week    Comment: weekly  . Drug use: Never     Allergies   Patient has no known allergies.   Review of Systems Review of Systems  Musculoskeletal: Positive for arthralgias, gait problem and joint swelling.  Skin: Positive for color change. Negative for wound.     Physical Exam Triage Vital Signs ED Triage Vitals [03/24/20 1311]  Enc Vitals Group     BP      Pulse      Resp      Temp      Temp src      SpO2  Weight 155 lb (70.3 kg)     Height 5\' 8"  (1.727 m)     Head Circumference      Peak Flow      Pain Score 2     Pain Loc      Pain Edu?      Excl. in [place]?    No data found.  Updated Vital Signs BP 109/75 (BP Location: Right Arm)   Ht 5\' 8"  (1.727 m)   Wt 155 lb (70.3 kg)   BMI 23.57 kg/m   Visual Acuity Right Eye Distance:   Left Eye Distance:   Bilateral Distance:    Right Eye Near:   Left Eye Near:    Bilateral Near:     Physical Exam Vitals and nursing note reviewed.  Constitutional:      Appearance: Normal appearance. She is well-developed.  HENT:     Head: Normocephalic and atraumatic.  Cardiovascular:     Rate and Rhythm: Normal rate.     Pulses:          Dorsalis pedis pulses are 2+ on the left side.       Posterior tibial pulses are 2+ on the left side.  Pulmonary:     Effort: Pulmonary effort is normal.  Musculoskeletal:        General: Swelling and  tenderness present. Normal range of motion.     Cervical back: Normal range of motion.     Comments: Left foot: mild edema dorsal aspect mid lateral foot, tenderness over 4th and 5th metacarpals. No tenderness to ankle or toes. Full ROM ankle and toes.  No calf tenderness.   Skin:    General: Skin is warm and dry.     Capillary Refill: Capillary refill takes less than 2 seconds.     Findings: Bruising present.     Comments: Left foot: skin in tact. Ecchymosis to dorsal aspect.   Neurological:     Mental Status: She is alert and oriented to person, place, and time.     Sensory: No sensory deficit.  Psychiatric:        Behavior: Behavior normal.      UC Treatments / Results  Labs (all labs ordered are listed, but only abnormal results are displayed) Labs Reviewed - No data to display  EKG   Radiology DG Foot Complete Left  Result Date: 03/24/2020 CLINICAL DATA:  Twisting injury to the left foot while hiking last night. EXAM: LEFT FOOT - COMPLETE 3+ VIEW COMPARISON:  None. FINDINGS: There is no evidence of fracture or dislocation. There is no evidence of arthropathy or other focal bone abnormality. Soft tissues are unremarkable. IMPRESSION: Negative. Electronically Signed   By: M.D.   On: 03/24/2020 13:35    Procedures Procedures (including critical care time)  Medications Ordered in UC Medications - No data to display  Initial Impression / Assessment and Plan / UC Course  I have reviewed the triage vital signs and the nursing notes.  Pertinent labs & imaging results that were available during my care of the patient were reviewed by me and considered in my medical decision making (see chart for details).     Reviewed imaging with pt Pt has crutches with her today. Pt has a walking boot at home she plans to use. Work note provided for tomorrow AVS given  Final Clinical Impressions(s) / UC Diagnoses   Final diagnoses:  Contusion of left foot, initial  encounter  Foot sprain, left, initial encounter  Discharge Instructions     Weightbearing as tolerated. Use your crutches and an ace wrap or boot for comfort.   You may take 500mg  acetaminophen every 4-6 hours or in combination with ibuprofen 400-600mg  every 6-8 hours as needed for pain and inflammation.  Norco/Vicodin (hydrocodone-acetaminophen) is a narcotic pain medication, do not combine these medications with others containing tylenol. While taking, do not drink alcohol, drive, or perform any other activities that requires focus while taking these medications.   Call to schedule a follow up appointment with Sports Medicine or an Orthopedist if not improving in 1-2 weeks with conservative treatment at home. Additional imaging may be indicated.     ED Prescriptions    Medication Sig Dispense Auth. Provider   HYDROcodone-acetaminophen (NORCO/VICODIN) 5-325 MG tablet Take 1 tablet by mouth every 6 (six) hours as needed for moderate pain or severe pain. 12 tablet , Lurene Shadow     I have reviewed the PDMP during this encounter.   [place], Lurene Shadow 03/25/20 0825

## 2020-11-19 ENCOUNTER — Other Ambulatory Visit: Payer: Self-pay | Admitting: Nurse Practitioner

## 2020-11-19 DIAGNOSIS — Z1231 Encounter for screening mammogram for malignant neoplasm of breast: Secondary | ICD-10-CM

## 2020-12-25 DIAGNOSIS — Z1231 Encounter for screening mammogram for malignant neoplasm of breast: Secondary | ICD-10-CM

## 2021-10-15 ENCOUNTER — Other Ambulatory Visit: Payer: Self-pay | Admitting: Nurse Practitioner

## 2021-10-15 DIAGNOSIS — Z1231 Encounter for screening mammogram for malignant neoplasm of breast: Secondary | ICD-10-CM

## 2021-11-25 ENCOUNTER — Ambulatory Visit
Admission: RE | Admit: 2021-11-25 | Discharge: 2021-11-25 | Disposition: A | Discharge status: Home / Self Care | Payer: 59 | Source: Ambulatory Visit | Attending: Nurse Practitioner | Admitting: Nurse Practitioner

## 2021-11-25 ENCOUNTER — Other Ambulatory Visit: Payer: Self-pay

## 2021-11-25 DIAGNOSIS — Z1231 Encounter for screening mammogram for malignant neoplasm of breast: Secondary | ICD-10-CM

## 2022-04-16 LAB — COLOGUARD: COLOGUARD: NEGATIVE

## 2022-04-16 LAB — EXTERNAL GENERIC LAB PROCEDURE: COLOGUARD: NEGATIVE

## 2022-10-14 ENCOUNTER — Other Ambulatory Visit: Payer: Self-pay | Admitting: Nurse Practitioner

## 2022-10-14 DIAGNOSIS — Z1231 Encounter for screening mammogram for malignant neoplasm of breast: Secondary | ICD-10-CM

## 2022-12-03 ENCOUNTER — Ambulatory Visit
Admission: RE | Admit: 2022-12-03 | Discharge: 2022-12-03 | Disposition: A | Discharge status: Home / Self Care | Payer: Commercial Managed Care - PPO | Source: Ambulatory Visit | Attending: Nurse Practitioner | Admitting: Nurse Practitioner

## 2022-12-03 DIAGNOSIS — Z1231 Encounter for screening mammogram for malignant neoplasm of breast: Secondary | ICD-10-CM

## 2023-10-20 ENCOUNTER — Other Ambulatory Visit: Payer: Self-pay | Admitting: Nurse Practitioner

## 2023-10-20 DIAGNOSIS — Z1231 Encounter for screening mammogram for malignant neoplasm of breast: Secondary | ICD-10-CM

## 2023-11-23 ENCOUNTER — Emergency Department (HOSPITAL_COMMUNITY)
Admission: EM | Admit: 2023-11-23 | Discharge: 2023-11-24 | Disposition: A | Discharge status: Home / Self Care | Payer: Self-pay | Attending: Emergency Medicine | Admitting: Emergency Medicine

## 2023-11-23 ENCOUNTER — Encounter (HOSPITAL_COMMUNITY): Payer: Self-pay | Admitting: Emergency Medicine

## 2023-11-23 ENCOUNTER — Other Ambulatory Visit: Payer: Self-pay

## 2023-11-23 ENCOUNTER — Emergency Department (HOSPITAL_COMMUNITY): Payer: Self-pay

## 2023-11-23 DIAGNOSIS — I498 Other specified cardiac arrhythmias: Secondary | ICD-10-CM

## 2023-11-23 DIAGNOSIS — R079 Chest pain, unspecified: Secondary | ICD-10-CM | POA: Diagnosis present

## 2023-11-23 DIAGNOSIS — R008 Other abnormalities of heart beat: Secondary | ICD-10-CM | POA: Diagnosis not present

## 2023-11-23 DIAGNOSIS — R002 Palpitations: Secondary | ICD-10-CM | POA: Diagnosis not present

## 2023-11-23 DIAGNOSIS — I5032 Chronic diastolic (congestive) heart failure: Secondary | ICD-10-CM | POA: Diagnosis not present

## 2023-11-23 LAB — COMPREHENSIVE METABOLIC PANEL
ALT: 14 U/L (ref 0–44)
AST: 15 U/L (ref 15–41)
Albumin: 4.1 g/dL (ref 3.5–5.0)
Alkaline Phosphatase: 49 U/L (ref 38–126)
Anion gap: 12 (ref 5–15)
BUN: 25 mg/dL — ABNORMAL HIGH (ref 8–23)
CO2: 21 mmol/L — ABNORMAL LOW (ref 22–32)
Calcium: 9.4 mg/dL (ref 8.9–10.3)
Chloride: 108 mmol/L (ref 98–111)
Creatinine, Ser: 0.51 mg/dL (ref 0.44–1.00)
GFR, Estimated: >60 mL/min (ref >60)
Glucose, Bld: 101 mg/dL — ABNORMAL HIGH (ref 70–99)
Potassium: 3.5 mmol/L (ref 3.5–5.1)
Sodium: 141 mmol/L (ref 135–145)
Total Bilirubin: 1 mg/dL (ref 0.0–1.2)
Total Protein: 7.5 g/dL (ref 6.5–8.1)

## 2023-11-23 LAB — TROPONIN I (HIGH SENSITIVITY): Troponin I (High Sensitivity): <2 ng/L (ref <18)

## 2023-11-23 LAB — CBC
HCT: 40.4 % (ref 36.0–46.0)
Hemoglobin: 13.7 g/dL (ref 12.0–15.0)
MCH: 30.8 pg (ref 26.0–34.0)
MCHC: 33.9 g/dL (ref 30.0–36.0)
MCV: 90.8 fL (ref 80.0–100.0)
Platelets: 273 10*3/uL (ref 150–400)
RBC: 4.45 MIL/uL (ref 3.87–5.11)
RDW: 12.2 % (ref 11.5–15.5)
WBC: 6 10*3/uL (ref 4.0–10.5)
nRBC: 0 % (ref 0.0–0.2)

## 2023-11-23 LAB — MAGNESIUM: Magnesium: 2 mg/dL (ref 1.7–2.4)

## 2023-11-23 LAB — TSH: TSH: 3.628 u[IU]/mL (ref 0.350–4.500)

## 2023-11-23 MED ORDER — ALBUTEROL SULFATE HFA 108 (90 BASE) MCG/ACT IN AERS: 2.0000 | INHALATION_SPRAY | RESPIRATORY_TRACT | Status: DC | PRN

## 2023-11-23 NOTE — ED Triage Notes (Signed)
 Patient presents due to chest pain, palpitations and shortness of breath. Symptoms started 5 days ago and has gotten worse over the past 2 days. Denies any radiating pain.

## 2023-11-23 NOTE — ED Provider Triage Note (Signed)
 Emergency Medicine Provider Triage Evaluation Note  Yvonne [name] , a 63 y.o. female  was evaluated in triage.  Pt complains of chest pain, shortness of breath.  Patient reports that she has been noticing sensations of palpitations with some chest heaviness and breathlessness for about 5 days.  States has gotten worse for last 2 days.  No history of cardiac abnormalities but does report a history of PVCs.  On chart review, chronic diastolic heart failure is noted.  She denies any recent changes or additions to any new medications.  No recent nausea, vomiting, or dehydration.  Does report a viral illness about 2 or 3 weeks ago prior to symptom onset.  Review of Systems  Positive: As above Negative: As above  Physical Exam  BP (!) 173/106 (BP Location: Right Arm)   Pulse (!) 41   Temp 98.1 F (36.7 C) (Oral)   Resp 10   SpO2 100%  Gen:   Awake, no distress   Resp:  Normal effort  MSK:   Moves extremities without difficulty  Other:    Medical Decision Making  Medically screening exam initiated at 6:57 PM.  Appropriate orders placed.  JONATHAN KIRKENDOLL was informed that the remainder of the evaluation will be completed by another provider, this initial triage assessment does not replace that evaluation, and the importance of remaining in the ED until their evaluation is complete.     Smitty Knudsen, PA-C 11/23/23 [phone]

## 2023-11-24 LAB — TROPONIN I (HIGH SENSITIVITY): Troponin I (High Sensitivity): <2 ng/L (ref <18)

## 2023-11-24 NOTE — ED Provider Notes (Signed)
 [place] EMERGENCY DEPARTMENT AT [facility] Provider Note   CSN: [id number] Arrival date & time: 11/23/23  1816     History  Chief Complaint  Patient presents with   Chest Pain   Shortness of Breath    Yvonne [name] is a 63 y.o. female.  Patient presents to the emergency department complaining of 5 days of palpitations with some subjective shortness of breath, worsening over the past 2 days.  She was seen in urgent care earlier today who recommended she come to the emergency department for further evaluation.  Past medical history significant for PVCs, chronic diastolic heart failure, exertional dyspnea, family history of premature CAD.  Patient states that she began to feel an irregular heartbeat.  She denies any associated chest pain or radiation of pain.  She endorses subjective shortness of breath stating that when she has tried an exercise bike or perform other oxygen require activities she can feel her heart beating irregularly which makes her feel short of breath.  She denies shortness of breath at rest right now.  Patient is a non-smoker, denies any recent surgeries, is not on hormone therapy, denies recent long distance travel.  Patient denies associated nausea, vomiting.  The patient does endorse previous history of cardiology due to her chronic diastolic heart failure but states she has not seen them in approximately 4 years.  She states she went to get an appointment today but they stated that she would be a new patient and that they would be unable to see her before May of this year.   Chest Pain Associated symptoms: shortness of breath   Shortness of Breath Associated symptoms: chest pain        Home Medications Prior to Admission medications   Medication Sig Start Date End Date Taking? Authorizing Provider  Calcium Carbonate-Vit D-Min (CALCIUM 1200 PO) Take 1 capsule by mouth daily.    [provider]  Coenzyme Q10 (CO Q 10) 100 MG CAPS Take 1  tablet by mouth daily.    [provider]  furosemide (LASIX) 20 MG tablet TAKE 1 TABLET Monday, Wednesday, AND Friday ONLY 02/09/20   Chilton Si, MD  HYDROcodone-acetaminophen (NORCO/VICODIN) 5-325 MG tablet Take 1 tablet by mouth every 6 (six) hours as needed for moderate pain or severe pain. 03/24/20   Lurene Shadow, PA-C      Allergies    Patient has no known allergies.    Review of Systems   Review of Systems  Respiratory:  Positive for shortness of breath.   Cardiovascular:  Positive for chest pain.    Physical Exam Updated Vital Signs BP (!) 143/104   Pulse 75   Temp 98 F (36.7 C) (Oral)   Resp 16   SpO2 100%  Physical Exam Vitals and nursing note reviewed.  HENT:     Head: Normocephalic and atraumatic.  Eyes:     Pupils: Pupils are equal, round, and reactive to light.  Cardiovascular:     Rate and Rhythm: Normal rate.  Pulmonary:     Effort: Pulmonary effort is normal. No respiratory distress.  Musculoskeletal:        General: No signs of injury.     Cervical back: Normal range of motion.  Skin:    General: Skin is dry.  Neurological:     Mental Status: She is alert.  Psychiatric:        Speech: Speech normal.        Behavior: Behavior normal.  ED Results / Procedures / Treatments   Labs (all labs ordered are listed, but only abnormal results are displayed) Labs Reviewed  COMPREHENSIVE METABOLIC PANEL - Abnormal; Notable for the following components:      Result Value   CO2 21 (*)    Glucose, Bld 101 (*)    BUN 25 (*)    All other components within normal limits  CBC  MAGNESIUM  TSH  TROPONIN I (HIGH SENSITIVITY)  TROPONIN I (HIGH SENSITIVITY)    EKG None  Radiology DG Chest 2 View Result Date: 11/23/2023 CLINICAL DATA:  Shortness of breath EXAM: CHEST - 2 VIEW COMPARISON:  None Available. FINDINGS: No consolidation, pneumothorax or effusion. No edema. Normal cardiopericardial silhouette. Overlapping cardiac leads.  IMPRESSION: No acute cardiopulmonary disease. Electronically Signed   By: Karen Kays M.D.   On: 11/23/2023 18:54    Procedures Procedures    Medications Ordered in ED Medications  albuterol (VENTOLIN HFA) 108 (90 Base) MCG/ACT inhaler 2 puff (has no administration in time range)    ED Course/ Medical Decision Making/ A&P                                 Medical Decision Making Amount and/or Complexity of Data Reviewed Labs: ordered. Radiology: ordered.  Risk Prescription drug management.   This patient presents to the ED for concern of palpitations, this involves an extensive number of treatment options, and is a complaint that carries with it a high risk of complications and morbidity.  The differential diagnosis includes dysrhythmia, electrolyte abnormality, others   Co morbidities that complicate the patient evaluation  Diastolic heart failure   Additional history obtained:   External records from outside source obtained and reviewed including urgent care notes   Lab Tests:  I Ordered, and personally interpreted labs.  The pertinent results include: Troponin less than 2 x 2, magnesium 2, normal potassium, normal TSH   Imaging Studies ordered:  I ordered imaging studies including chest x-ray I independently visualized and interpreted imaging which showed no acute disease I agree with the radiologist interpretation   Cardiac Monitoring: / EKG:  The patient was maintained on a cardiac monitor.  I personally viewed and interpreted the cardiac monitored which showed an underlying rhythm of: Ventricular trigeminy   Test / Admission - Considered:  Patient with palpitations and EKG showing ventricular trigeminy.  She denies any chest pain at this time.  Heart score of 3.  She denies any shortness of breath at this time stating that she feels that her shortness of breath is more due to anxiety over feeling the irregular heartbeat.  I discussed potentially scanning  with CT chest to rule out pulmonary embolism or other abnormality not seen on the chest x-ray and the patient declined stating she does not feel that it is a pulmonary embolism, does not want a CT at this time which is reasonable.  At this time I see no indication for admission or further emergent workup.  Will discharge home with referral to cardiology.  I do feel that patient would benefit from cardiology follow-up sooner than the planned appointment in May.  Return precautions provided.         Final Clinical Impression(s) / ED Diagnoses Final diagnoses:  Palpitations  Ventricular trigeminy    Rx / DC Orders ED Discharge Orders          Ordered    Ambulatory referral to  Cardiology  Status:  Canceled       Comments: If you have not heard from the Cardiology office within the next 72 hours please call [phone].   11/24/23 0250    Ambulatory referral to Cardiology       Comments: If you have not heard from the Cardiology office within the next 72 hours please call [phone].   11/24/23 0251              Darrick Grinder, PA-C 11/24/23 0252    Nira Conn, MD 11/24/23 [phone]

## 2023-11-24 NOTE — Discharge Instructions (Addendum)
 Your workup tonight was significant for ventricular trigeminy on your EKG.  The rest of your workup was overall reassuring with normal electrolyte levels and a nonacute chest x-ray.  I have placed a referral to cardiology.  They should contact you to schedule an appointment.  If you do not hear from them within the next 3 days contact them to schedule the appointment.  If you develop worsening symptoms or other life-threatening symptoms please return to the emergency department.
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
## 2023-11-29 ENCOUNTER — Other Ambulatory Visit (HOSPITAL_BASED_OUTPATIENT_CLINIC_OR_DEPARTMENT_OTHER)

## 2023-11-29 ENCOUNTER — Ambulatory Visit (INDEPENDENT_AMBULATORY_CARE_PROVIDER_SITE_OTHER): Payer: No Typology Code available for payment source | Admitting: Cardiovascular Disease

## 2023-11-29 ENCOUNTER — Encounter (HOSPITAL_BASED_OUTPATIENT_CLINIC_OR_DEPARTMENT_OTHER): Payer: Self-pay | Admitting: Cardiovascular Disease

## 2023-11-29 VITALS — BP 138/92 | HR 80 | Ht 68.0 in | Wt 170.0 lb

## 2023-11-29 DIAGNOSIS — I5032 Chronic diastolic (congestive) heart failure: Secondary | ICD-10-CM

## 2023-11-29 DIAGNOSIS — I493 Ventricular premature depolarization: Secondary | ICD-10-CM

## 2023-11-29 DIAGNOSIS — Z8249 Family history of ischemic heart disease and other diseases of the circulatory system: Secondary | ICD-10-CM | POA: Diagnosis not present

## 2023-11-29 MED ORDER — DILTIAZEM HCL ER COATED BEADS 120 MG PO CP24: 120.0000 mg | ORAL_CAPSULE | Freq: Every day | ORAL | 3 refills | Status: AC

## 2023-11-29 NOTE — Patient Instructions (Signed)
 Medication Instructions:  ONCE YOU COMPLETE THE 3 DAY ZIO STOP LOSARTAN AND START DILTIAZEM 120 MG DAILY   *If you need a refill on your cardiac medications before your next appointment, please call your pharmacy*  Lab Work: NONE  Testing/Procedures: Your physician has requested that you have an echocardiogram. Echocardiography is a painless test that uses sound waves to create images of your heart. It provides your doctor with information about the size and shape of your heart and how well your heart's chambers and valves are working. This procedure takes approximately one hour. There are no restrictions for this procedure. Please do NOT wear cologne, perfume, aftershave, or lotions (deodorant is allowed). Please arrive 15 minutes prior to your appointment time.  Please note: We ask at that you not bring children with you during ultrasound (echo/ vascular) testing. Due to room size and safety concerns, children are not allowed in the ultrasound rooms during exams. Our front office staff cannot provide observation of children in our lobby area while testing is being conducted. An adult accompanying a patient to their appointment will only be allowed in the ultrasound room at the discretion of the ultrasound technician under special circumstances. We apologize for any inconvenience.  3 DAY ZIO   Follow-Up: At [facility], you and your health needs are our priority.  As part of our continuing mission to provide you with exceptional heart care, we have created designated Provider Care Teams.  These Care Teams include your primary Cardiologist (physician) and Advanced Practice Providers (APPs -  Physician Assistants and Nurse Practitioners) who all work together to provide you with the care you need, when you need it.  We recommend signing up for the patient portal called "MyChart".  Sign up information is provided on this After Visit Summary.  MyChart is used to connect with patients for  Virtual Visits (Telemedicine).  Patients are able to view lab/test results, encounter notes, upcoming appointments, etc.  Non-urgent messages can be sent to your provider as well.   To learn more about what you can do with MyChart, go to [email or web address].    Your next appointment:   6 TO 8 week(s)  Provider:   Chilton Si, MD, Eligha Bridegroom, NP, or Gillian Shields, NP    Other Instructions Christena Deem- Long Term Monitor Instructions  Your physician has requested you wear a ZIO patch monitor for 3 days.  This is a single patch monitor. [employer] supplies one patch monitor per enrollment. Additional stickers are not available. Please do not apply patch if you will be having a Nuclear Stress Test,  Echocardiogram, Cardiac CT, MRI, or Chest Xray during the period you would be wearing the  monitor. The patch cannot be worn during these tests. You cannot remove and re-apply the  ZIO XT patch monitor.  Your ZIO patch monitor will be mailed 3 day USPS to your address on file. It may take 3-5 days  to receive your monitor after you have been enrolled.  Once you have received your monitor, please review the enclosed instructions. Your monitor  has already been registered assigning a specific monitor serial # to you.  Billing and Patient Assistance Program Information  We have supplied [employer] with any of your insurance information on file for billing purposes. [employer] offers a sliding scale Patient Assistance Program for patients that do not have  insurance, or whose insurance does not completely cover the cost of the ZIO monitor.  You must apply for the Patient Assistance Program  to qualify for this discounted rate.  To apply, please call [employer] at [phone], select option 4, select option 2, ask to apply for  Patient Assistance Program. Meredeth Ide will ask your household income, and how many people  are in your household. They will quote your out-of-pocket cost based on that  information.  [employer] will also be able to set up a 3-month, interest-free payment plan if needed.  Applying the monitor   Shave hair from upper left chest.  Hold abrader disc by orange tab. Rub abrader in 40 strokes over the upper left chest as  indicated in your monitor instructions.  Clean area with 4 enclosed alcohol pads. Let dry.  Apply patch as indicated in monitor instructions. Patch will be placed under collarbone on left  side of chest with arrow pointing upward.  Rub patch adhesive wings for 2 minutes. Remove white label marked "1". Remove the white  label marked "2". Rub patch adhesive wings for 2 additional minutes.  While looking in a mirror, press and release button in center of patch. A small green light will  flash 3-4 times. This will be your only indicator that the monitor has been turned on.  Do not shower for the first 24 hours. You may shower after the first 24 hours.  Press the button if you feel a symptom. You will hear a small click. Record Date, Time and  Symptom in the Patient Logbook.  When you are ready to remove the patch, follow instructions on the last 2 pages of Patient  Logbook. Stick patch monitor onto the last page of Patient Logbook.  Place Patient Logbook in the blue and white box. Use locking tab on box and tape box closed  securely. The blue and white box has prepaid postage on it. Please place it in the mailbox as  soon as possible. Your physician should have your test results approximately 7 days after the  monitor has been mailed back to [facility].  Call [facility] Customer Care at [phone] if you have questions regarding  your ZIO XT patch monitor. Call them immediately if you see an orange light blinking on your  monitor.  If your monitor falls off in less than 4 days, contact our Monitor department at [phone].  If your monitor becomes loose or falls off after 4 days call [employer] at [phone] for  suggestions on  securing your monitor

## 2023-12-07 ENCOUNTER — Ambulatory Visit
Admission: RE | Admit: 2023-12-07 | Discharge: 2023-12-07 | Disposition: A | Discharge status: Home / Self Care | Payer: Commercial Managed Care - PPO | Source: Ambulatory Visit | Attending: Nurse Practitioner | Admitting: Nurse Practitioner

## 2023-12-07 DIAGNOSIS — Z1231 Encounter for screening mammogram for malignant neoplasm of breast: Secondary | ICD-10-CM

## 2023-12-16 ENCOUNTER — Ambulatory Visit (HOSPITAL_BASED_OUTPATIENT_CLINIC_OR_DEPARTMENT_OTHER)

## 2023-12-16 DIAGNOSIS — I493 Ventricular premature depolarization: Secondary | ICD-10-CM | POA: Diagnosis not present

## 2023-12-16 DIAGNOSIS — I5032 Chronic diastolic (congestive) heart failure: Secondary | ICD-10-CM

## 2023-12-16 LAB — ECHOCARDIOGRAM COMPLETE
Area-P 1/2: 4.31 cm2
S' Lateral: 2.56 cm

## 2023-12-17 ENCOUNTER — Encounter (HOSPITAL_BASED_OUTPATIENT_CLINIC_OR_DEPARTMENT_OTHER): Payer: Self-pay | Admitting: Cardiovascular Disease

## 2024-01-07 ENCOUNTER — Encounter (HOSPITAL_BASED_OUTPATIENT_CLINIC_OR_DEPARTMENT_OTHER): Payer: Self-pay | Admitting: *Deleted

## 2024-01-17 ENCOUNTER — Encounter (HOSPITAL_BASED_OUTPATIENT_CLINIC_OR_DEPARTMENT_OTHER): Payer: Self-pay | Admitting: Cardiovascular Disease

## 2024-01-17 NOTE — Telephone Encounter (Signed)
 Pt wore monitor 3 days, no data collected, will she be billed?

## 2024-01-20 ENCOUNTER — Ambulatory Visit (INDEPENDENT_AMBULATORY_CARE_PROVIDER_SITE_OTHER): Admitting: Nurse Practitioner

## 2024-01-20 ENCOUNTER — Encounter (HOSPITAL_BASED_OUTPATIENT_CLINIC_OR_DEPARTMENT_OTHER): Payer: Self-pay | Admitting: Nurse Practitioner

## 2024-01-20 VITALS — BP 128/88 | HR 68 | Ht 68.0 in | Wt 169.2 lb

## 2024-01-20 DIAGNOSIS — Z8249 Family history of ischemic heart disease and other diseases of the circulatory system: Secondary | ICD-10-CM

## 2024-01-20 DIAGNOSIS — I5189 Other ill-defined heart diseases: Secondary | ICD-10-CM

## 2024-01-20 DIAGNOSIS — I493 Ventricular premature depolarization: Secondary | ICD-10-CM

## 2024-01-20 DIAGNOSIS — I10 Essential (primary) hypertension: Secondary | ICD-10-CM

## 2024-01-20 DIAGNOSIS — E785 Hyperlipidemia, unspecified: Secondary | ICD-10-CM

## 2024-01-20 NOTE — Patient Instructions (Signed)
 Medication Instructions:  Your physician recommends that you continue on your current medications as directed. Please refer to the Current Medication list given to you today.  Testing/Procedures: Your physician has recommend you to have a coronary calcium score. This is a self pay test that will cost $99   Follow-Up: At [facility], you and your health needs are our priority.  As part of our continuing mission to provide you with exceptional heart care, our providers are all part of one team.  This team includes your primary Cardiologist (physician) and Advanced Practice Providers or APPs (Physician Assistants and Nurse Practitioners) who all work together to provide you with the care you need, when you need it.  Your next appointment:   1 year with Dr. Theodis Fiscal, Slater Duncan, NP or Neomi Banks, NP
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
## 2024-01-27 ENCOUNTER — Ambulatory Visit: Payer: Commercial Managed Care - PPO | Admitting: Cardiology

## 2024-02-29 ENCOUNTER — Other Ambulatory Visit (HOSPITAL_BASED_OUTPATIENT_CLINIC_OR_DEPARTMENT_OTHER)

## 2024-03-23 ENCOUNTER — Encounter (HOSPITAL_BASED_OUTPATIENT_CLINIC_OR_DEPARTMENT_OTHER): Payer: Self-pay | Admitting: Nurse Practitioner
